# Patient Record
Sex: Female | Born: 1965
Health system: Southern US, Community
[De-identification: ages and names within clinical notes are randomized; demographics above are authoritative.]

## PROBLEM LIST (undated history)

## (undated) DIAGNOSIS — J45909 Unspecified asthma, uncomplicated: Secondary | ICD-10-CM

## (undated) DIAGNOSIS — J309 Allergic rhinitis, unspecified: Secondary | ICD-10-CM

## (undated) HISTORY — PX: NO PAST SURGERIES: SHX2092

## (undated) HISTORY — DX: Allergic rhinitis, unspecified: J30.9

## (undated) HISTORY — DX: Unspecified asthma, uncomplicated: J45.909

---

## 2013-08-18 ENCOUNTER — Other Ambulatory Visit: Payer: BC Managed Care – PPO

## 2013-08-18 ENCOUNTER — Ambulatory Visit (INDEPENDENT_AMBULATORY_CARE_PROVIDER_SITE_OTHER)
Admission: RE | Admit: 2013-08-18 | Discharge: 2013-08-18 | Disposition: A | Discharge status: Home / Self Care | Payer: BC Managed Care – PPO | Source: Ambulatory Visit | Attending: Pulmonary Disease | Admitting: Pulmonary Disease

## 2013-08-18 ENCOUNTER — Ambulatory Visit (INDEPENDENT_AMBULATORY_CARE_PROVIDER_SITE_OTHER): Payer: BC Managed Care – PPO | Admitting: Pulmonary Disease

## 2013-08-18 ENCOUNTER — Encounter (INDEPENDENT_AMBULATORY_CARE_PROVIDER_SITE_OTHER): Payer: Self-pay

## 2013-08-18 ENCOUNTER — Encounter: Payer: Self-pay | Admitting: Pulmonary Disease

## 2013-08-18 VITALS — BP 102/72 | HR 66 | Temp 97.9°F | Ht 63.0 in | Wt 154.0 lb

## 2013-08-18 DIAGNOSIS — J45909 Unspecified asthma, uncomplicated: Secondary | ICD-10-CM

## 2013-08-18 DIAGNOSIS — R0602 Shortness of breath: Secondary | ICD-10-CM

## 2013-08-18 DIAGNOSIS — J309 Allergic rhinitis, unspecified: Secondary | ICD-10-CM

## 2013-08-18 NOTE — Progress Notes (Addendum)
Subjective:    Patient ID: Meagan Herrera, female    DOB: [birth year], 48 y.o.   MRN: [id number]  HPI  Meagan Herrera is here to see me today for asthma.  She states that since January 2014 she developed a hacking cough that lasted for a month.  It was treated with prednisone.  Since then she has had recurrent cough over the course of the year.  She has also noted increasing shortness of breath and has been using her albuterol more often throughout the last year.  She has been feeling a pressure in her chest.  She had allergies as a child and has dealt with them throughout most of her adult life.  She used to experience dyspnea and cough when out in the cold.  She had lung function testing at one point and was started on Advair.  She had PFTs at one point.  She smoked very few cigarettes for about a year only when "at the bar".  Prior to January 2014 she only needed albuterol prn and would go months without using it.  In the last two she has started on Symbicort.  It helped with chest tightness.  Over the last few years she has noted chest tightness and rattling in her chest and dyspnea after taking ketoprofen and ibuprofen.  She also thought that she had an aspirin allergy over the years because her mother told her she had an allergy.  She has noted sinus symptoms prior to starting flonase about 2 years ago.  No symptoms since.  No dietary changes in the last 2-3 years.    She has noted moisture problems in the house and she is having the house being checked for mold.      Past Medical History  Diagnosis Date  . Asthma   . Allergic rhinitis      Family History  Problem Relation Age of Onset  . Emphysema Mother   . Lung cancer Mother   . Allergies Father   . Emphysema Maternal Grandfather      History   Social History  . Marital Status: Married    Spouse Name: N/A    Number of Children: N/A  . Years of Education: N/A   Occupational History  . Not on file.   Social History  Main Topics  . Smoking status: Never Smoker   . Smokeless tobacco: Never Used  . Alcohol Use: Yes  . Drug Use: No  . Sexual Activity: Not on file   Other Topics Concern  . Not on file   Social History Narrative  . No narrative on file     Allergies  Allergen Reactions  . Ibuprofen     Wheezing  . Penicillins Rash  . Sulfa Antibiotics Rash     No outpatient prescriptions prior to visit.   No facility-administered medications prior to visit.      Review of Systems  Constitutional: Negative for fever, chills, diaphoresis, activity change, appetite change, fatigue and unexpected weight change.  HENT: Positive for congestion. Negative for dental problem, ear discharge, ear pain, facial swelling, hearing loss, mouth sores, nosebleeds, postnasal drip, rhinorrhea, sinus pressure, sneezing, sore throat, tinnitus, trouble swallowing and voice change.   Eyes: Negative for photophobia, discharge, itching and visual disturbance.  Respiratory: Positive for cough and shortness of breath. Negative for apnea, choking, chest tightness, wheezing and stridor.   Cardiovascular: Negative for chest pain, palpitations and leg swelling.  Gastrointestinal: Negative for nausea, vomiting, abdominal pain, constipation, blood  in stool and abdominal distention.  Genitourinary: Negative for dysuria, urgency, frequency, hematuria, flank pain, decreased urine volume and difficulty urinating.  Musculoskeletal: Negative for arthralgias, back pain, gait problem, joint swelling, myalgias, neck pain and neck stiffness.  Skin: Negative for color change, pallor and rash.  Neurological: Negative for dizziness, tremors, seizures, syncope, speech difficulty, weakness, light-headedness, numbness and headaches.  Hematological: Negative for adenopathy. Does not bruise/bleed easily.  Psychiatric/Behavioral: Negative for confusion, sleep disturbance and agitation. The patient is not nervous/anxious.        Objective:    Physical Exam Filed Vitals:   08/18/13 1412 08/18/13 1413  BP:  102/72  Pulse:  66  Temp: 97.9 F (36.6 C)   TempSrc: Oral   Height: 5\' 3"  (1.6 m)   Weight: 154 lb (69.854 kg)   SpO2:  96%   RA  Gen: well appearing, no acute distress HEENT: NCAT, PERRL, EOMi, OP clear, neck supple without masses PULM: CTA B CV: RRR, no mgr, no JVD AB: BS+, soft, nontender, no hsm Ext: warm, no edema, no clubbing, no cyanosis Derm: no rash or skin breakdown Neuro: A&Ox4, CN II-XII intact, strength 5/5 in all 4 extremities       Assessment & Plan:   Moderate Persistent Asthma Meagan Herrera has symptoms consistent with moderate persistent asthma.  We need to get lung function testing and a chest x-ray to assess the severity of airflow obstruction and rule out other pulmonary problems.    It is not clear to me why she has "fallen off the cliff" lately in regards to the recent worsening in her symptoms.  For years her symptoms were fairly quiescent, but lately something has changed.  Because of this we need to get a serum IgE and RAST testing.  I question the possibility of salicylate allergy (sampter's triad) given the sinus congestion, history of aspirin allergy since childhood, and asthma.  Plan: -check PFTs, CXR, IgE and RAST testing -if all this comes back OK, will try to decrease symbicort to QVar 53mcg 2 puffs twice per day with a spacer  Allergic rhinitis Her allergic rhinitis could certainly contribute to her asthma severity. -continue singulair, zyrtec, and flonase    Updated Medication List Outpatient Encounter Prescriptions as of 08/18/2013  Medication Sig  . budesonide-formoterol (SYMBICORT) 160-4.5 MCG/ACT inhaler Inhale 2 puffs into the lungs 2 (two) times daily.  . calcium carbonate (OS-CAL) 600 MG TABS tablet Take 600 mg by mouth 2 (two) times daily with a meal.  . cetirizine (ZYRTEC ALLERGY) 10 MG tablet Take 10 mg by mouth daily.  . fluticasone (FLONASE) 50 MCG/ACT nasal spray  Place 2 sprays into both nostrils daily.  . montelukast (SINGULAIR) 10 MG tablet Take 10 mg by mouth at bedtime.   Addendum:  After our visit today the patient found out that her husband has been diagnosed with HIV/AIDS.  Based on this I will order an HIV test.  I have spoken to the patient about this and will help facilitate appropriate counseling based on the results of the test.  Jillyn Hidden PCCM Pager: [phone] Cell: [phone] If no response, call [phone]

## 2013-08-18 NOTE — Patient Instructions (Signed)
We will set up pulmonary function testing and Chest X-rays for you We will call you with the results of those tests and your blood work Keep taking all your medicines for now We will see you back in 4-6 weeks or sooner if needed

## 2013-08-18 NOTE — Assessment & Plan Note (Signed)
Her allergic rhinitis could certainly contribute to her asthma severity. -continue singulair, zyrtec, and flonase

## 2013-08-18 NOTE — Assessment & Plan Note (Signed)
Meagan Herrera has symptoms consistent with moderate persistent asthma.  We need to get lung function testing and a chest x-ray to assess the severity of airflow obstruction and rule out other pulmonary problems.    It is not clear to me why she has "fallen off the cliff" lately in regards to the recent worsening in her symptoms.  For years her symptoms were fairly quiescent, but lately something has changed.  Because of this we need to get a serum IgE and RAST testing.  I question the possibility of salicylate allergy (sampter's triad) given the sinus congestion, history of aspirin allergy since childhood, and asthma.  Plan: -check PFTs, CXR, IgE and RAST testing -if all this comes back OK, will try to decrease symbicort to QVar 36mcg 2 puffs twice per day with a spacer

## 2013-08-19 ENCOUNTER — Ambulatory Visit (HOSPITAL_COMMUNITY)
Admission: RE | Admit: 2013-08-19 | Discharge: 2013-08-19 | Disposition: A | Discharge status: Home / Self Care | Payer: BC Managed Care – PPO | Source: Ambulatory Visit | Attending: Pulmonary Disease | Admitting: Pulmonary Disease

## 2013-08-19 ENCOUNTER — Ambulatory Visit: Payer: BC Managed Care – PPO

## 2013-08-19 ENCOUNTER — Telehealth: Payer: Self-pay | Admitting: Pulmonary Disease

## 2013-08-19 DIAGNOSIS — R0609 Other forms of dyspnea: Secondary | ICD-10-CM | POA: Insufficient documentation

## 2013-08-19 DIAGNOSIS — Z79899 Other long term (current) drug therapy: Secondary | ICD-10-CM | POA: Insufficient documentation

## 2013-08-19 DIAGNOSIS — J45909 Unspecified asthma, uncomplicated: Secondary | ICD-10-CM | POA: Insufficient documentation

## 2013-08-19 DIAGNOSIS — R0602 Shortness of breath: Secondary | ICD-10-CM

## 2013-08-19 DIAGNOSIS — R0989 Other specified symptoms and signs involving the circulatory and respiratory systems: Secondary | ICD-10-CM | POA: Insufficient documentation

## 2013-08-19 LAB — PULMONARY FUNCTION TEST
DL/VA % pred: 107 %
DL/VA: 4.87 ml/min/mmHg/L
DLCO UNC % PRED: 127 %
DLCO unc: 27.41 ml/min/mmHg
FEF 25-75 PRE: 2.39 L/s
FEF 25-75 Post: 2.08 L/sec
FEF2575-%Change-Post: -13 %
FEF2575-%PRED-POST: 78 %
FEF2575-%Pred-Pre: 90 %
FEV1-%CHANGE-POST: -2 %
FEV1-%PRED-POST: 106 %
FEV1-%Pred-Pre: 109 %
FEV1-Post: 2.79 L
FEV1-Pre: 2.87 L
FEV1FVC-%Change-Post: -3 %
FEV1FVC-%Pred-Pre: 95 %
FEV6-%Change-Post: 0 %
FEV6-%PRED-POST: 117 %
FEV6-%PRED-PRE: 116 %
FEV6-POST: 3.77 L
FEV6-PRE: 3.75 L
FEV6FVC-%CHANGE-POST: 0 %
FEV6FVC-%PRED-PRE: 102 %
FEV6FVC-%Pred-Post: 102 %
FVC-%Change-Post: 0 %
FVC-%PRED-PRE: 113 %
FVC-%Pred-Post: 114 %
FVC-POST: 3.78 L
FVC-PRE: 3.75 L
POST FEV6/FVC RATIO: 100 %
Post FEV1/FVC ratio: 74 %
Pre FEV1/FVC ratio: 76 %
Pre FEV6/FVC Ratio: 100 %
RV % PRED: 148 %
RV: 2.49 L
TLC % PRED: 131 %
TLC: 6.23 L

## 2013-08-19 LAB — IGE: IGE (IMMUNOGLOBULIN E), SERUM: 42.5 [IU]/mL (ref 0.0–180.0)

## 2013-08-19 MED ORDER — ALBUTEROL SULFATE (2.5 MG/3ML) 0.083% IN NEBU
2.5000 mg | INHALATION_SOLUTION | Freq: Once | RESPIRATORY_TRACT | Status: AC
  Administered 2013-08-19: 2.5 mg via RESPIRATORY_TRACT

## 2013-08-19 NOTE — Telephone Encounter (Signed)
Pt is calling for lab results from yesterday and this morning.

## 2013-08-19 NOTE — Telephone Encounter (Signed)
Notes Recorded by Juanito Doom, MD on 08/18/2013 at 9:44 PM A, Please let her know that this was normal Thanks B --  I spoke with patient about results and she verbalized understanding and had no questions. She is also requesting her lab results as well. Please advise Dr. Lake Bells thanks

## 2013-08-19 NOTE — Telephone Encounter (Signed)
I received a call from the lab and orders needed to be made future. This has been done. Finlayson Bing, CMA

## 2013-08-19 NOTE — Telephone Encounter (Signed)
I called and spoke w/ Meagan Herrera. Advised her we have the message over to Dr. Lake Bells. Please advise thanks

## 2013-08-19 NOTE — Addendum Note (Signed)
Addended by: Simonne Maffucci B on: 08/19/2013 09:43 AM   Modules accepted: Orders

## 2013-08-20 LAB — ALLERGY FULL PROFILE
Allergen, D pternoyssinus,d7: 3.22 kU/L — ABNORMAL HIGH
Allergen,Goose feathers, e70: <0.1 kU/L
Aspergillus fumigatus, m3: <0.1 kU/L
BAHIA GRASS: 1.79 kU/L — AB
Bermuda Grass: 0.24 kU/L — ABNORMAL HIGH
CAT DANDER: 5.19 kU/L — AB
Common Ragweed: <0.1 kU/L
D. farinae: 5.1 kU/L — ABNORMAL HIGH
DOG DANDER: 0.25 kU/L — AB
Elm IgE: <0.1 kU/L
FESCUE: 5.48 kU/L — AB
G005 Rye, Perennial: 5.13 kU/L — ABNORMAL HIGH
G009 Red Top: 5.65 kU/L — ABNORMAL HIGH
House Dust Hollister: 1.41 kU/L — ABNORMAL HIGH
IGE (IMMUNOGLOBULIN E), SERUM: 45.7 [IU]/mL (ref 0.0–180.0)
LAMB'S QUARTERS CLASS: 0.11 kU/L — AB
Oak: 0.42 kU/L — ABNORMAL HIGH
Plantain: <0.1 kU/L
Stemphylium Botryosum: <0.1 kU/L
Sycamore Tree: <0.1 kU/L
Timothy Grass: 4.13 kU/L — ABNORMAL HIGH

## 2013-08-20 LAB — HIV ANTIBODY (ROUTINE TESTING W REFLEX): HIV: NONREACTIVE

## 2013-08-20 NOTE — Telephone Encounter (Signed)
Called spoke with patient.  She stated that she is currently at the hospital with her spouse and saw BQ there, who reported that he will return to speak with her regarding her results and any additional questions she has.  Will sign off.

## 2013-08-20 NOTE — Telephone Encounter (Signed)
Pt states she spoke to Dr Lake Bells this morning regarding her tests from yesterday but she forgot to ask him about the results for tests from 3/17.

## 2013-08-21 ENCOUNTER — Telehealth: Payer: Self-pay

## 2013-08-21 ENCOUNTER — Encounter: Payer: Self-pay | Admitting: Pulmonary Disease

## 2013-08-21 NOTE — Telephone Encounter (Signed)
Message copied by Len Blalock on Fri Aug 21, 2013  3:37 PM ------      Message from: Simonne Maffucci B      Created: Fri Aug 21, 2013  9:02 AM       A,            Please let her know that the PFT showed mild asthma and there is no need to change our recommendations from our office visit.            Thanks      B ------

## 2013-08-21 NOTE — Telephone Encounter (Signed)
Pt aware of results and recs.  Nothing further needed. 

## 2013-09-29 ENCOUNTER — Encounter: Payer: Self-pay | Admitting: Pulmonary Disease

## 2013-09-29 ENCOUNTER — Ambulatory Visit (INDEPENDENT_AMBULATORY_CARE_PROVIDER_SITE_OTHER): Payer: BC Managed Care – PPO | Admitting: Pulmonary Disease

## 2013-09-29 VITALS — BP 114/76 | HR 71 | Ht 63.0 in | Wt 147.0 lb

## 2013-09-29 DIAGNOSIS — J45909 Unspecified asthma, uncomplicated: Secondary | ICD-10-CM

## 2013-09-29 MED ORDER — BECLOMETHASONE DIPROPIONATE 80 MCG/ACT IN AERS: 2.0000 | INHALATION_SPRAY | Freq: Two times a day (BID) | RESPIRATORY_TRACT | Status: DC

## 2013-09-29 MED ORDER — AEROCHAMBER MV MISC: Status: DC

## 2013-09-29 NOTE — Patient Instructions (Signed)
Stop Symbicort Use QVar 2 puffs twice a day with a spacer If you are doing fine in 6 weeks, decrease the dose to 1 puff twice a day We will see you back in 3 months or sooner if needed

## 2013-09-29 NOTE — Progress Notes (Signed)
   Subjective:    Patient ID: Meagan Herrera, female    DOB: 07-07-1965, 48 y.o.   MRN: [id number]   Synopsis: Mild intermittent asthma referred initially in early 2015 for recurrent exacerbations. IgE was found to be normal. 08/18/2013 PFT> ratio 74%, FEV1 2.79L (106% pred, -2% change), TLC 6.23L (131% pred), DLCO 21.56 (127% pred)  HPI  09/29/2013 ROV> Since her last visit [name] has been doing well. Her breathing has improved and she has not had to use her albuterol inhaler very often. She and her family have been under a very high amount of stress because her husband has been critically ill. So she states that she intermittently experiences chest tightness but she feels that this is mostly anxiety and not related to her asthma. She has not noticed exertional dyspnea. She has no cough.  Past Medical History  Diagnosis Date  . Asthma   . Allergic rhinitis      Review of Systems     Objective:   Physical Exam  Filed Vitals:   09/29/13 1422  BP: 114/76  Pulse: 71  Height: 5\' 3"  (1.6 m)  Weight: 147 lb (66.679 kg)  SpO2: 99%  RA  Gen: well appearing, no acute distress HEENT: NCAT, EOMi, OP clear,  PULM: CTA B CV: RRR, no mgr, no JVD AB: BS+, soft, nontender, no hsm Ext: warm, no edema, no clubbing, no cyanosis Derm: no rash or skin breakdown        Assessment & Plan:   Moderate Persistent Asthma This has been a stable interval for [name]. There is no clear underlying cause for her exacerbations back in the wintertime other than recurrent infections.  Plan: -Stop Symbicort -Start Qvar for 3 months -Hopefully we can back off on the steroid inhaler by the summertime if she remains stable    Updated Medication List Outpatient Encounter Prescriptions as of 09/29/2013  Medication Sig  . ALPRAZolam (XANAX) 0.25 MG tablet Take 0.25 mg by mouth at bedtime as needed for anxiety.  . budesonide-formoterol (SYMBICORT) 160-4.5 MCG/ACT inhaler Inhale 2 puffs into the lungs 2 (two)  times daily.  . cetirizine (ZYRTEC ALLERGY) 10 MG tablet Take 10 mg by mouth daily.  . fluticasone (FLONASE) 50 MCG/ACT nasal spray Place 2 sprays into both nostrils daily.  . montelukast (SINGULAIR) 10 MG tablet Take 10 mg by mouth at bedtime.  . Multiple Vitamin (MULTIVITAMIN WITH MINERALS) TABS tablet Take 1 tablet by mouth daily.  . [DISCONTINUED] calcium carbonate (OS-CAL) 600 MG TABS tablet Take 600 mg by mouth 2 (two) times daily with a meal.

## 2013-09-30 NOTE — Assessment & Plan Note (Signed)
This has been a stable interval for Meagan Herrera. There is no clear underlying cause for her exacerbations back in the wintertime other than recurrent infections.  Plan: -Stop Symbicort -Start Qvar for 3 months -Hopefully we can back off on the steroid inhaler by the summertime if she remains stable

## 2013-10-29 ENCOUNTER — Telehealth: Payer: Self-pay | Admitting: Pulmonary Disease

## 2013-10-29 NOTE — Telephone Encounter (Signed)
Spoke with pt.  Pt needs form filled out for out of work time due to husband's death.  Her out of work dates were 08/18/13 through 10/11/13.  Form placed in Dr Anastasia Pall look at.

## 2013-10-29 NOTE — Telephone Encounter (Signed)
Spoke with Dr Lake Bells.  Form completed.  Copy placed in scan folder.

## 2015-01-07 ENCOUNTER — Ambulatory Visit (INDEPENDENT_AMBULATORY_CARE_PROVIDER_SITE_OTHER): Payer: BLUE CROSS/BLUE SHIELD | Admitting: Podiatry

## 2015-01-07 ENCOUNTER — Ambulatory Visit (INDEPENDENT_AMBULATORY_CARE_PROVIDER_SITE_OTHER): Payer: BLUE CROSS/BLUE SHIELD

## 2015-01-07 VITALS — BP 105/63 | HR 47 | Resp 15 | Ht 63.0 in | Wt 138.0 lb

## 2015-01-07 DIAGNOSIS — M79672 Pain in left foot: Secondary | ICD-10-CM

## 2015-01-07 DIAGNOSIS — D3610 Benign neoplasm of peripheral nerves and autonomic nervous system, unspecified: Secondary | ICD-10-CM | POA: Diagnosis not present

## 2015-01-07 DIAGNOSIS — M2042 Other hammer toe(s) (acquired), left foot: Secondary | ICD-10-CM

## 2015-01-07 NOTE — Progress Notes (Signed)
   Subjective:    Patient ID: Meagan Herrera, female    DOB: [birth year], 49 y.o.   MRN: [id number]  HPI Patient presents with foot pain in their left foot, ball of foot. Pt stated, "has discomfort in foot, feels like a marble in foot"; Going on for 3 months.  Patient also presents with possible hammertoe, left foot, great toe is going over 2nd toe. Pt stated, "just noticed in the past month".    Review of Systems  All other systems reviewed and are negative.      Objective:   Physical Exam        Assessment & Plan:

## 2015-01-10 NOTE — Progress Notes (Signed)
Subjective:     Patient ID: Meagan Herrera, female   DOB: 1965-12-28, 49 y.o.   MRN: [id number]  HPI patient presents stating she was concerned because it feels like a marble and her left foot even though it's not hurting at this time and also that there is been some movement of her second toe across her big toe. Does not give history of injury   Review of Systems  All other systems reviewed and are negative.      Objective:   Physical Exam  Constitutional: She is oriented to person, place, and time.  Cardiovascular: Intact distal pulses.   Musculoskeletal: Normal range of motion.  Neurological: She is oriented to person, place, and time.  Skin: Skin is warm.  Nursing note and vitals reviewed.  neurovascular status intact muscle strength adequate range of motion within normal limits. Patient's noted to have good digital perfusion is well oriented 3 with no equinus condition and is noted to have medial and dorsal dislocation of the second toe left with mild inflammation of the joint and is found to have a palpable mass third interspace left that is nontender when pressed on     Assessment:     Probable flexor plate stretch of the second MPJ left with possibility for neuroma symptomatology left currently nonsymptomatic    Plan:     H&P and x-rays reviewed. Advised her on the causes of condition and flexor plate dysfunction along with possibility for neuroma and what we will do if it becomes symptomatic. Wear wider-type shoes and soaks at the current time

## 2015-03-23 DIAGNOSIS — M2042 Other hammer toe(s) (acquired), left foot: Secondary | ICD-10-CM

## 2015-04-01 ENCOUNTER — Telehealth: Payer: Self-pay | Admitting: Podiatry

## 2015-04-01 NOTE — Telephone Encounter (Signed)
Called pt to let her know I had a copy of her x-rays on a CD ready for her. I offered her to come to our [place] office to pick them up, take them to our main office for her to pick up, or that I could put them in the mail to her. Pt requested to come to the [place] office this afternoon to pick them up.

## 2015-10-13 ENCOUNTER — Ambulatory Visit (INDEPENDENT_AMBULATORY_CARE_PROVIDER_SITE_OTHER): Payer: BLUE CROSS/BLUE SHIELD | Admitting: Neurology

## 2015-10-13 ENCOUNTER — Encounter: Payer: Self-pay | Admitting: Neurology

## 2015-10-13 VITALS — BP 90/53 | HR 52 | Ht 63.0 in | Wt 126.5 lb

## 2015-10-13 DIAGNOSIS — R253 Fasciculation: Secondary | ICD-10-CM | POA: Diagnosis not present

## 2015-10-13 DIAGNOSIS — L299 Pruritus, unspecified: Secondary | ICD-10-CM | POA: Diagnosis not present

## 2015-10-13 MED ORDER — LIDOCAINE 5 % EX OINT: 1.0000 "application " | TOPICAL_OINTMENT | CUTANEOUS | Status: DC | PRN

## 2015-10-13 NOTE — Progress Notes (Signed)
[id number]    Provider:  Dr Jaynee Eagles Referring Provider: Bartholome Bill, MD Primary Care Physician:  Bartholome Bill, MD  CC:  Eyelid twitching  HPI:  Meagan Herrera is a 50 y.o. female here as a referral from Dr. Luciana Axe for eye lid twitching that started at the beginning of this year. Past medical history of osteopenia, hip and foot pain. It doesn't cause discomfort, she ca';t feel it. It is happening in the left eye and she can feel it. No muscle twitching anywhere else. She also has an itching, tingling small spot in on the inside of the scapular, itching daily for years. No trauma. No pain or weakness. Some days is worse than others. She takes daily antihistamine. She drinks caffeine daily. She recently decreased her caffeine intake and the twitching in her right eyelid occurred. Doesn't happen all the time she does not think. If she stretches her eye out she can make a twitch more. No vision changes. No headaches. No other focal neurologic deficits or sensory changes. No history of neuromuscular disorders in her family. She was sent for evaluation for Botox of the right eye. However she doesn't notice the symptoms only when she looks in the [place] and they're not causing any deficits whatsoever. No weakness. No family history of neuromuscular disorder.  Reviewed notes, labs and imaging from outside physicians, which showed: Reviewed notes from outside physician. Diagnosed her with spasm of the eyelid. CMP, CBC, TSH, rheumatoid factor, ANA with reflex, sedimentation rate and magnesium level ordered. She has complained of stiffness and pain in the right hip and feet started a year ago had more recent. She started dancing since this is contributing to the pain. Her brother had parathyroid disease and she has osteopenia. She also has a constant right twitch on the right eye for several months she denies headaches or changes in her vision. Started in January. Exam was normal at  primary care physician.  HIV 08/19/2013 negative.  Review of Systems: Patient complains of symptoms per HPI as well as the following symptoms: . Pertinent negatives per HPI. All others negative.   Social History   Social History  . Marital Status: Widowed    Spouse Name: N/A  . Number of Children: 2  . Years of Education: Masters   Occupational History  . [employer]    Social History Main Topics  . Smoking status: Never Smoker   . Smokeless tobacco: Never Used  . Alcohol Use: 0.0 oz/week    0 Standard drinks or equivalent per week     Comment: 0-1 drink per week  . Drug Use: No  . Sexual Activity: Not on file   Other Topics Concern  . Not on file   Social History Narrative   Lives with 2 children   Caffeine use: 1/2 decaf and 1/2 regular coffee per day    Family History  Problem Relation Age of Onset  . Emphysema Mother   . Lung cancer Mother   . Allergies Father   . Emphysema Maternal Grandfather     Past Medical History  Diagnosis Date  . Asthma   . Allergic rhinitis     Past Surgical History  Procedure Laterality Date  . No past surgeries      Current Outpatient Prescriptions  Medication Sig Dispense Refill  . Calcium Carbonate-Vit D-Min (CALTRATE 600+D PLUS PO) Take 1 Dose by mouth daily. 2000 Vit D    . cetirizine (ZYRTEC ALLERGY) 10 MG tablet Take  10 mg by mouth daily.    . fluticasone (FLONASE) 50 MCG/ACT nasal spray Place 2 sprays into both nostrils daily.    . montelukast (SINGULAIR) 10 MG tablet Take 10 mg by mouth at bedtime.    Marland Kitchen Spacer/Aero-Holding Chambers (AEROCHAMBER MV) inhaler Use as instructed 1 each 0   No current facility-administered medications for this visit.    Allergies as of 10/13/2015 - Review Complete 10/13/2015  Allergen Reaction Noted  . Ibuprofen  08/18/2013  . Penicillins Rash 08/18/2013  . Sulfa antibiotics Rash 08/18/2013    Vitals: BP 90/53 mmHg  Pulse 52  Ht 5\' 3"  (1.6 m)  Wt 126 lb 8 oz (57.38  kg)  BMI 22.41 kg/m2 Last Weight:  Wt Readings from Last 1 Encounters:  10/13/15 126 lb 8 oz (57.38 kg)   Last Height:   Ht Readings from Last 1 Encounters:  10/13/15 5\' 3"  (1.6 m)    Physical exam: Exam: Gen: NAD, conversant, well nourised, obese, well groomed                     CV: RRR, no MRG. No Carotid Bruits. No peripheral edema, warm, nontender Eyes: Conjunctivae clear without exudates or hemorrhage  Neuro: Detailed Neurologic Exam  Speech:    Speech is normal; fluent and spontaneous with normal comprehension.  Cognition:    The patient is oriented to person, place, and time;     recent and remote memory intact;     language fluent;     normal attention, concentration,     fund of knowledge Cranial Nerves:    The pupils are equal, round, and reactive to light. The fundi are normal and spontaneous venous pulsations are present. Visual fields are full to finger confrontation. Extraocular movements are intact. Trigeminal sensation is intact and the muscles of mastication are normal. The face is symmetric. The palate elevates in the midline. Hearing intact. Voice is normal. Shoulder shrug is normal. The tongue has normal motion without fasciculations.   Coordination:    Normal finger to nose and heel to shin. Normal rapid alternating movements.   Gait:    Heel-toe and tandem gait are normal.   Motor Observation:    No asymmetry, no atrophy. Brief episode of right eyelid twitching noted.  Tone:    Normal muscle tone.    Posture:    Posture is normal. normal erect    Strength:    Strength is V/V in the upper and lower limbs.      Sensation: intact to LT     Reflex Exam:  DTR's:    Deep tendon reflexes in the upper and lower extremities are normal bilaterally.   Toes:    The toes are downgoing bilaterally.   Clonus:    Clonus is absent.      Assessment/Plan:  50 year old female with right eyelid twitching and a spot on the skin of the right scapula that  itches. Do not recommend Botox in the right eye at this time, she doesn't feel the symptoms, they don't bother her, they don't cause any side effects or any vision changes. Advise her to stop caffeine and daily antihistamine use as these can cause benign fasciculations. For the spot in her right shoulder, can try topical lidocaine several times a day otherwise she should possibly follow-up with primary care or a dermatologist.  Cc: Precious Haws M.D.  Sarina Ill, MD  [facility] [address] [place], [place] [address]  Phone  [phone] Fax [phone]

## 2015-10-13 NOTE — Patient Instructions (Signed)
Remember to drink plenty of fluid, eat healthy meals and do not skip any meals. Try to eat protein with a every meal and eat a healthy snack such as fruit or nuts in between meals. Try to keep a regular sleep-wake schedule and try to exercise daily, particularly in the form of walking, 20-30 minutes a day, if you can.   As far as your medications are concerned, I would like to suggest: Topical Lidocaine  As far as diagnostic testing: none  I would like to see you back as needed, sooner if we need to. Please call us with any interim questions, concerns, problems, updates or refill requests.   Our phone number is [phone]. We also have an after hours call service for urgent matters and there is a physician on-call for urgent questions. For any emergencies you know to call 911 or go to the nearest emergency room

## 2015-10-14 ENCOUNTER — Encounter: Payer: Self-pay | Admitting: Neurology

## 2015-10-28 ENCOUNTER — Telehealth: Payer: Self-pay | Admitting: *Deleted

## 2015-10-28 NOTE — Telephone Encounter (Signed)
Message For: [facility]                  Taken 26-MAY-17 at 10:09AM by TMW ------------------------------------------------------------ Susy Frizzle [facility]     CID [id number]  Patient Meagan Herrera        Pt's [facility]        Area Code 336 Phone# [phone]  * DOB [phone]      RE DID PATIENT COME TO APPT ON MAY 11.PCB PM [place]                                        Disp:Y/N N If Y = C/B If No Response In 63minutes ============================================================

## 2017-01-08 ENCOUNTER — Other Ambulatory Visit: Payer: Self-pay | Admitting: Family Medicine

## 2017-01-08 ENCOUNTER — Ambulatory Visit
Admission: RE | Admit: 2017-01-08 | Discharge: 2017-01-08 | Disposition: A | Discharge status: Home / Self Care | Payer: BC Managed Care – PPO | Source: Ambulatory Visit | Attending: Family Medicine | Admitting: Family Medicine

## 2017-01-08 DIAGNOSIS — M533 Sacrococcygeal disorders, not elsewhere classified: Secondary | ICD-10-CM

## 2017-02-11 LAB — HM PAP SMEAR

## 2018-05-26 IMAGING — CR DG SACRUM/COCCYX 2+V
3 series · 3 of 3 positions shown · non-contrast
Comparison: None.

CLINICAL DATA: 51-year-old female with sacrococcygeal pain when
sitting or lying down for 3 months with no known injury.

EXAM:
SACRUM AND COCCYX - 2+ VIEW

[w sacrum ap]
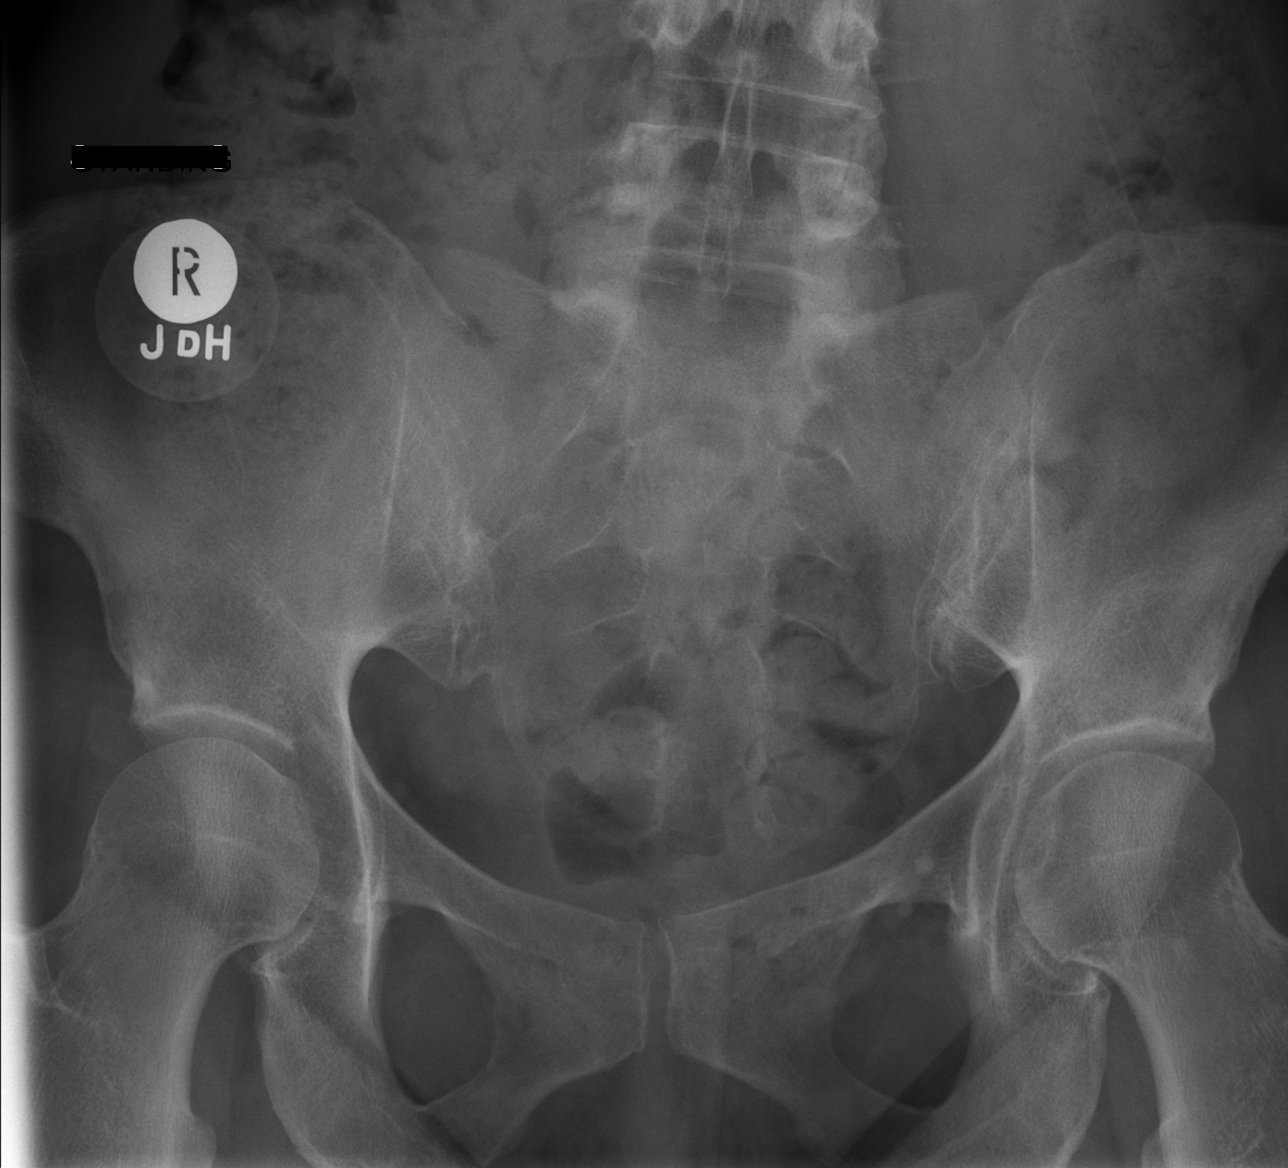

[w coccyx ap]
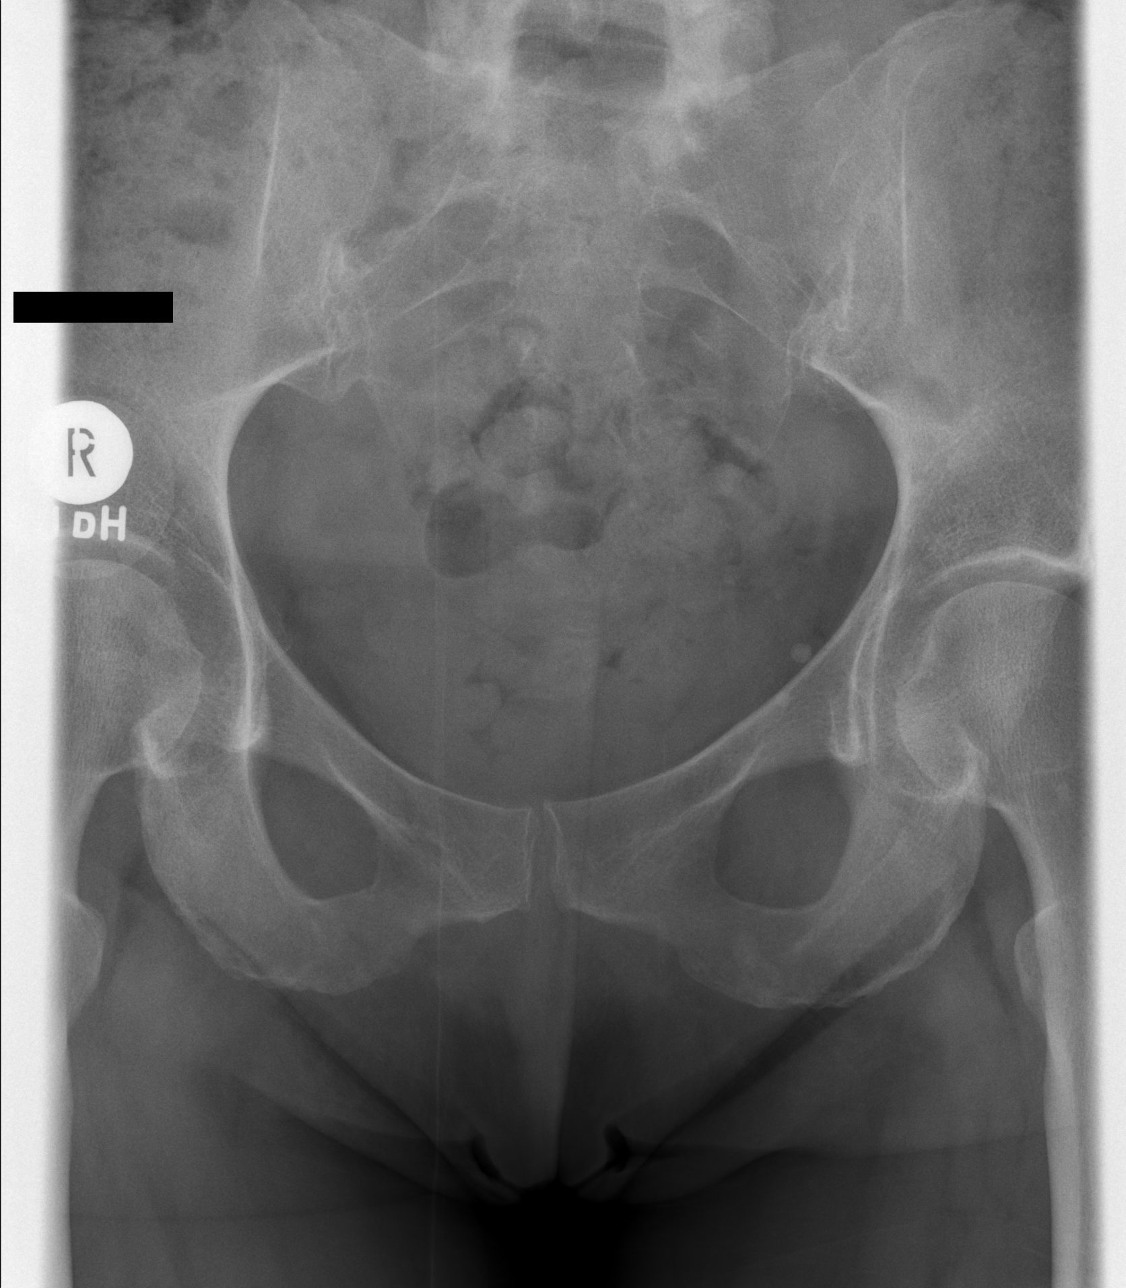

[w sacrum coccyx lat]
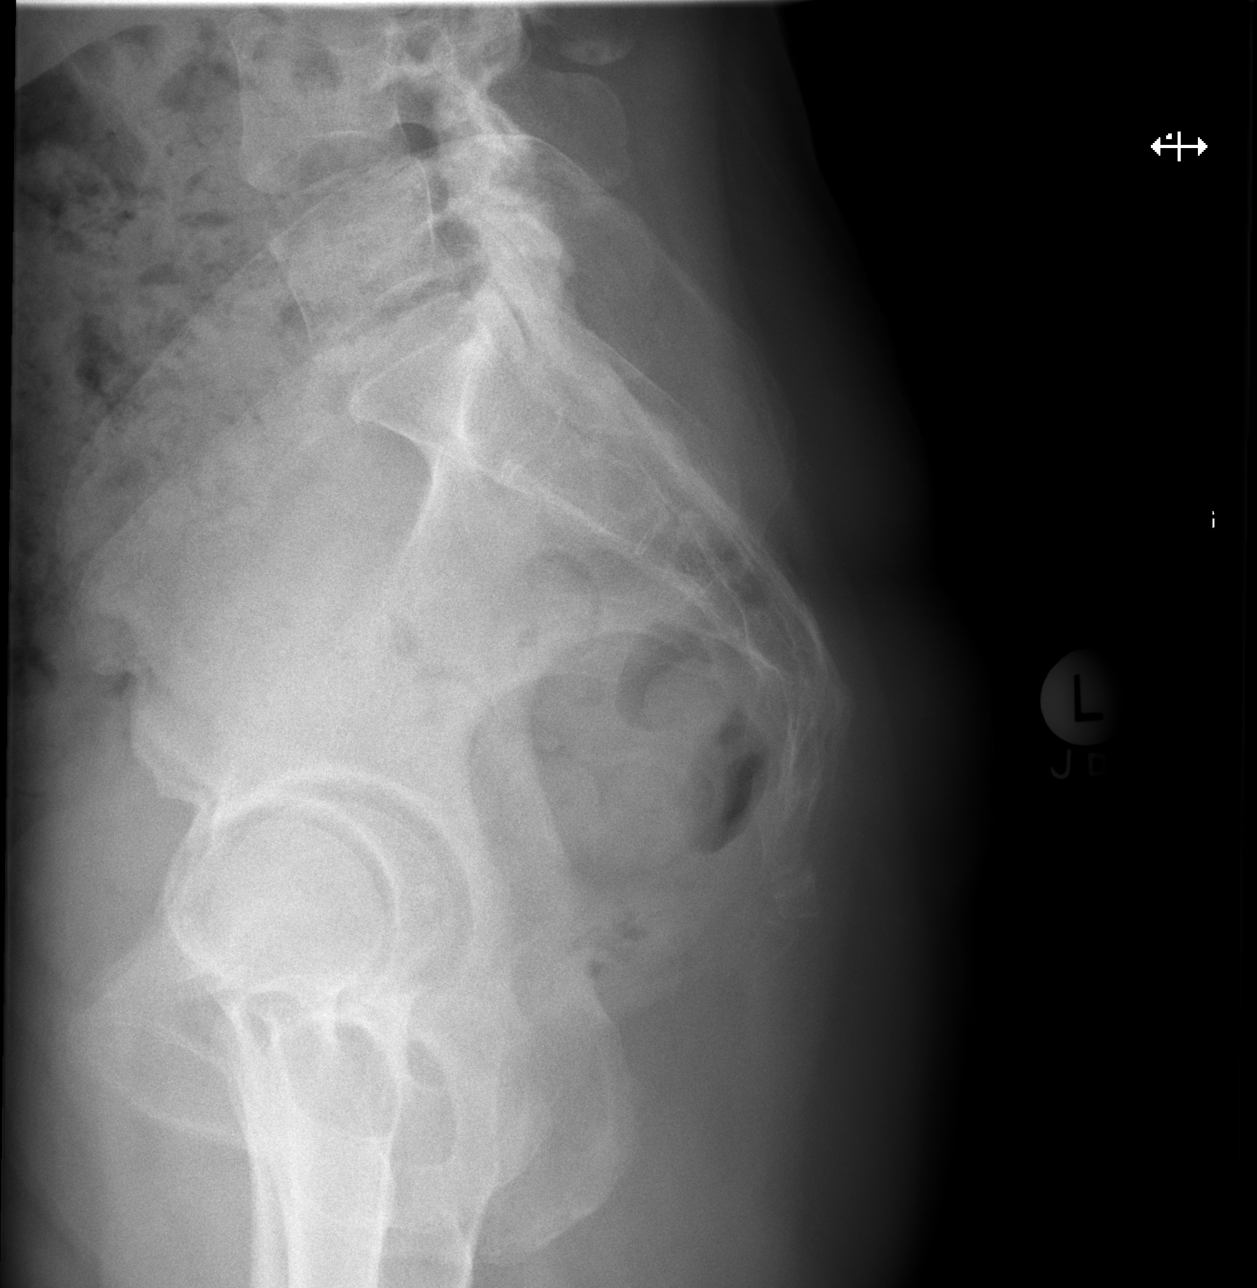

[3 of 3 positions shown; findings below may reference images not displayed]

FINDINGS: Standing views. The sacral ala appear intact. The SI joints appear
normal. On the lateral view there is irregular S5 sacral segment and
coccygeal alignment, which appears chronic. Underlying Bone
mineralization is within normal limits. Negative visible lumbar
spine. The visible pelvis elsewhere appears intact. Negative visible
bowel gas pattern with retained stool in the colon.
IMPRESSION: Possible prior coccygeal fracture, but no acute osseous abnormality
identified.

## 2018-10-06 DIAGNOSIS — M7541 Impingement syndrome of right shoulder: Secondary | ICD-10-CM | POA: Insufficient documentation

## 2019-01-13 ENCOUNTER — Ambulatory Visit (INDEPENDENT_AMBULATORY_CARE_PROVIDER_SITE_OTHER): Payer: BC Managed Care – PPO | Admitting: Family Medicine

## 2019-01-13 ENCOUNTER — Encounter: Payer: Self-pay | Admitting: Family Medicine

## 2019-01-13 VITALS — BP 98/60 | HR 49 | Temp 98.1°F | Ht 63.0 in | Wt 120.0 lb

## 2019-01-13 DIAGNOSIS — M25562 Pain in left knee: Secondary | ICD-10-CM

## 2019-01-13 DIAGNOSIS — Z13220 Encounter for screening for lipoid disorders: Secondary | ICD-10-CM

## 2019-01-13 DIAGNOSIS — R7989 Other specified abnormal findings of blood chemistry: Secondary | ICD-10-CM

## 2019-01-13 DIAGNOSIS — Z0000 Encounter for general adult medical examination without abnormal findings: Secondary | ICD-10-CM

## 2019-01-13 LAB — CBC WITH DIFFERENTIAL/PLATELET
Basophils Absolute: 0 10*3/uL (ref 0.0–0.1)
Basophils Relative: 0.6 % (ref 0.0–3.0)
Eosinophils Absolute: 0.2 10*3/uL (ref 0.0–0.7)
Eosinophils Relative: 4.2 % (ref 0.0–5.0)
HCT: 43 % (ref 36.0–46.0)
Hemoglobin: 14 g/dL (ref 12.0–15.0)
Lymphocytes Relative: 49.8 % — ABNORMAL HIGH (ref 12.0–46.0)
Lymphs Abs: 2.1 10*3/uL (ref 0.7–4.0)
MCHC: 32.6 g/dL (ref 30.0–36.0)
MCV: 92.6 fl (ref 78.0–100.0)
Monocytes Absolute: 0.3 10*3/uL (ref 0.1–1.0)
Monocytes Relative: 7 % (ref 3.0–12.0)
Neutro Abs: 1.6 10*3/uL (ref 1.4–7.7)
Neutrophils Relative %: 38.4 % — ABNORMAL LOW (ref 43.0–77.0)
Platelets: 201 10*3/uL (ref 150.0–400.0)
RBC: 4.64 Mil/uL (ref 3.87–5.11)
RDW: 13.4 % (ref 11.5–15.5)
WBC: 4.2 10*3/uL (ref 4.0–10.5)

## 2019-01-13 LAB — LIPID PANEL
Cholesterol: 193 mg/dL (ref 0–200)
HDL: 74.8 mg/dL (ref >39.00)
LDL Cholesterol: 102 mg/dL — ABNORMAL HIGH (ref 0–99)
NonHDL: 118.64
Total CHOL/HDL Ratio: 3
Triglycerides: 81 mg/dL (ref 0.0–149.0)
VLDL: 16.2 mg/dL (ref 0.0–40.0)

## 2019-01-13 LAB — COMPREHENSIVE METABOLIC PANEL
ALT: 14 U/L (ref 0–35)
AST: 22 U/L (ref 0–37)
Albumin: 4.4 g/dL (ref 3.5–5.2)
Alkaline Phosphatase: 70 U/L (ref 39–117)
BUN: 12 mg/dL (ref 6–23)
CO2: 28 mEq/L (ref 19–32)
Calcium: 10 mg/dL (ref 8.4–10.5)
Chloride: 106 mEq/L (ref 96–112)
Creatinine, Ser: 0.83 mg/dL (ref 0.40–1.20)
GFR: 71.3 mL/min (ref >60.00)
Glucose, Bld: 87 mg/dL (ref 70–99)
Potassium: 4.5 mEq/L (ref 3.5–5.1)
Sodium: 141 mEq/L (ref 135–145)
Total Bilirubin: 0.6 mg/dL (ref 0.2–1.2)
Total Protein: 6.9 g/dL (ref 6.0–8.3)

## 2019-01-13 LAB — TSH: TSH: 4.07 u[IU]/mL (ref 0.35–4.50)

## 2019-01-13 LAB — VITAMIN D 25 HYDROXY (VIT D DEFICIENCY, FRACTURES): VITD: 47.85 ng/mL (ref 30.00–100.00)

## 2019-01-13 NOTE — Assessment & Plan Note (Signed)
Well adult Orders Placed This Encounter  Procedures  . Comp Met (CMET)  . CBC w/Diff  . Lipid panel  . TSH  . Vitamin D (25 hydroxy)  . Ambulatory referral to Orthopedic Surgery    Referral Priority:   Routine    Referral Type:   Surgical    Referral Reason:   Specialty Services Required    Requested Specialty:   Orthopedic Surgery    Number of Visits Requested:   1  Screening: Lipid Immunizations: UTD Anticipatory guidance/Risk Factor Reduction: Recommendations per AVS.

## 2019-01-13 NOTE — Assessment & Plan Note (Signed)
Referral placed to ortho

## 2019-01-13 NOTE — Patient Instructions (Signed)

## 2019-01-13 NOTE — Progress Notes (Signed)
Meagan Herrera - 53 y.o. female MRN [id number]  Date of birth: [birth year]  Subjective Chief Complaint  Patient presents with  . Establish Care    CPE- fasting/ referral to ortho?/ denies tdap/ left big toenails split    HPI Meagan Herrera is a 53 y.o. female here today for initial visit and annual exam.  She has a history of asthma but has otherwise been fairly healthy.  She was recently seen by her previous PCP for L knee pain and prescribed prednisone.  She is hesitant to take this and would like to see orthopedics.  She reports that her asthma is fairly well controlled.  There is a family history of lung cancer in both of her parents who were smokers.  She is a lifelong nonsmoker.  Paps and mammogram are up to date and she has these completed with her Gyn.  She had cologuard last May.  She stays active and exercises a few times per week.  She follows a balanced diet.     Review of Systems  Constitutional: Negative for chills, fever, malaise/fatigue and weight loss.  HENT: Negative for congestion, ear pain and sore throat.   Eyes: Negative for blurred vision, double vision and pain.  Respiratory: Negative for cough and shortness of breath.   Cardiovascular: Negative for chest pain and palpitations.  Gastrointestinal: Negative for abdominal pain, blood in stool, constipation, heartburn and nausea.  Genitourinary: Negative for dysuria and urgency.  Musculoskeletal: Positive for joint pain (L knee). Negative for myalgias.  Neurological: Negative for dizziness and headaches.  Endo/Heme/Allergies: Does not bruise/bleed easily.  Psychiatric/Behavioral: Negative for depression. The patient is not nervous/anxious and does not have insomnia.      Allergies  Allergen Reactions  . Ibuprofen     Wheezing  . Penicillins Rash  . Sulfa Antibiotics Rash    Past Medical History:  Diagnosis Date  . Allergic rhinitis   . Asthma     Past Surgical History:  Procedure Laterality Date  . NO PAST  SURGERIES      Social History   Socioeconomic History  . Marital status: Widowed    Spouse name: Not on file  . Number of children: 2  . Years of education: Masters  . Highest education level: Not on file  Occupational History  . Occupation: [employer]  Social Needs  . Financial resource strain: Not on file  . Food insecurity    Worry: Not on file    Inability: Not on file  . Transportation needs    Medical: Not on file    Non-medical: Not on file  Tobacco Use  . Smoking status: Never Smoker  . Smokeless tobacco: Never Used  Substance and Sexual Activity  . Alcohol use: Yes    Alcohol/week: 0.0 standard drinks    Comment: 0-1 drink per week  . Drug use: No  . Sexual activity: Not on file  Lifestyle  . Physical activity    Days per week: Not on file    Minutes per session: Not on file  . Stress: Not on file  Relationships  . Social Herbalist on phone: Not on file    Gets together: Not on file    Attends religious service: Not on file    Active member of club or organization: Not on file    Attends meetings of clubs or organizations: Not on file    Relationship status: Not on file  Other Topics Concern  . Not on  file  Social History Narrative   Lives with 2 children   Caffeine use: 1/2 decaf and 1/2 regular coffee per day    Family History  Problem Relation Age of Onset  . Emphysema Mother   . Lung cancer Mother   . Allergies Father   . Emphysema Maternal Grandfather     Health Maintenance  Topic Date Due  . TETANUS/TDAP  10/11/1982  . MAMMOGRAM  10/10/2013  . COLONOSCOPY  10/10/2013  . INFLUENZA VACCINE  01/03/2019  . PAP SMEAR-Modifier  02/18/2019  . Hepatitis C Screening  Completed  . HIV Screening  Completed    ----------------------------------------------------------------------------------------------------------------------------------------------------------------------------------------------------------------- Physical  Exam BP 98/60   Pulse (!) 49   Temp 98.1 F (36.7 C) (Oral)   Ht _0  (1.6 m)   Wt 120 lb (54.4 kg)   SpO2 97%   BMI 21.26 kg/m   Physical Exam Constitutional:      General: She is not in acute distress. HENT:     Head: Normocephalic and atraumatic.     Nose: Nose normal.  Eyes:     General: No scleral icterus.    Conjunctiva/sclera: Conjunctivae normal.  Neck:     Musculoskeletal: Normal range of motion and neck supple.     Thyroid: No thyromegaly.  Cardiovascular:     Rate and Rhythm: Normal rate and regular rhythm.     Heart sounds: Normal heart sounds.  Pulmonary:     Effort: Pulmonary effort is normal.     Breath sounds: Normal breath sounds.  Abdominal:     General: Bowel sounds are normal. There is no distension.     Palpations: Abdomen is soft.     Tenderness: There is no abdominal tenderness. There is no guarding.  Musculoskeletal: Normal range of motion.  Lymphadenopathy:     Cervical: No cervical adenopathy.  Skin:    General: Skin is warm and dry.     Findings: No rash.  Neurological:     Mental Status: She is alert and oriented to person, place, and time.     Cranial Nerves: No cranial nerve deficit.     Coordination: Coordination normal.  Psychiatric:        Behavior: Behavior normal.     ------------------------------------------------------------------------------------------------------------------------------------------------------------------------------------------------------------------- Assessment and Plan  Acute pain of left knee Referral placed to ortho.   Well adult exam Well adult Orders Placed This Encounter  Procedures  . Comp Met (CMET)  . CBC w/Diff  . Lipid panel  . TSH  . Vitamin D (25 hydroxy)  . Ambulatory referral to Orthopedic Surgery    Referral Priority:   Routine    Referral Type:   Surgical    Referral Reason:   Specialty Services Required    Requested Specialty:   Orthopedic Surgery    Number of Visits  Requested:   1  Screening: Lipid Immunizations: UTD Anticipatory guidance/Risk Factor Reduction: Recommendations per AVS.

## 2019-01-16 ENCOUNTER — Encounter: Payer: Self-pay | Admitting: Family Medicine

## 2019-01-16 ENCOUNTER — Telehealth: Payer: Self-pay

## 2019-01-16 NOTE — Telephone Encounter (Signed)
Pt labs in review by Dr. Zigmund Daniel will call once these are released  Copied from [place] [phone]. Topic: General - Other >> Jan 15, 2019  3:31 PM Yvette Rack wrote: Reason for CRM: Pt called for lab results. Pt requests call back

## 2019-01-19 NOTE — Telephone Encounter (Signed)
Pt given results via mychart by Dr. Zigmund Daniel.

## 2019-06-30 ENCOUNTER — Other Ambulatory Visit: Payer: Self-pay | Admitting: Family Medicine

## 2019-12-23 ENCOUNTER — Other Ambulatory Visit: Payer: Self-pay | Admitting: Family Medicine

## 2019-12-30 ENCOUNTER — Other Ambulatory Visit: Payer: Self-pay

## 2019-12-31 ENCOUNTER — Encounter: Payer: Self-pay | Admitting: Nurse Practitioner

## 2019-12-31 ENCOUNTER — Ambulatory Visit: Payer: 59 | Admitting: Nurse Practitioner

## 2019-12-31 VITALS — BP 96/60 | HR 52 | Temp 98.0°F | Ht 62.99 in | Wt 121.8 lb

## 2019-12-31 DIAGNOSIS — Z136 Encounter for screening for cardiovascular disorders: Secondary | ICD-10-CM

## 2019-12-31 DIAGNOSIS — Z13220 Encounter for screening for lipoid disorders: Secondary | ICD-10-CM

## 2019-12-31 DIAGNOSIS — J4520 Mild intermittent asthma, uncomplicated: Secondary | ICD-10-CM | POA: Diagnosis not present

## 2019-12-31 DIAGNOSIS — E559 Vitamin D deficiency, unspecified: Secondary | ICD-10-CM | POA: Diagnosis not present

## 2019-12-31 DIAGNOSIS — Z0000 Encounter for general adult medical examination without abnormal findings: Secondary | ICD-10-CM

## 2019-12-31 LAB — COMPREHENSIVE METABOLIC PANEL
ALT: 15 U/L (ref 0–35)
AST: 26 U/L (ref 0–37)
Albumin: 4.4 g/dL (ref 3.5–5.2)
Alkaline Phosphatase: 60 U/L (ref 39–117)
BUN: 16 mg/dL (ref 6–23)
CO2: 25 mEq/L (ref 19–32)
Calcium: 10.2 mg/dL (ref 8.4–10.5)
Chloride: 107 mEq/L (ref 96–112)
Creatinine, Ser: 0.91 mg/dL (ref 0.40–1.20)
GFR: 63.9 mL/min (ref >60.00)
Glucose, Bld: 85 mg/dL (ref 70–99)
Potassium: 4.7 mEq/L (ref 3.5–5.1)
Sodium: 140 mEq/L (ref 135–145)
Total Bilirubin: 0.6 mg/dL (ref 0.2–1.2)
Total Protein: 6.8 g/dL (ref 6.0–8.3)

## 2019-12-31 LAB — CBC
HCT: 42.7 % (ref 36.0–46.0)
Hemoglobin: 14.2 g/dL (ref 12.0–15.0)
MCHC: 33.3 g/dL (ref 30.0–36.0)
MCV: 91.9 fl (ref 78.0–100.0)
Platelets: 186 10*3/uL (ref 150.0–400.0)
RBC: 4.65 Mil/uL (ref 3.87–5.11)
RDW: 13.4 % (ref 11.5–15.5)
WBC: 4.5 10*3/uL (ref 4.0–10.5)

## 2019-12-31 LAB — LIPID PANEL
Cholesterol: 200 mg/dL (ref 0–200)
HDL: 74.9 mg/dL (ref >39.00)
LDL Cholesterol: 111 mg/dL — ABNORMAL HIGH (ref 0–99)
NonHDL: 125.01
Total CHOL/HDL Ratio: 3
Triglycerides: 71 mg/dL (ref 0.0–149.0)
VLDL: 14.2 mg/dL (ref 0.0–40.0)

## 2019-12-31 LAB — TSH: TSH: 4.09 u[IU]/mL (ref 0.35–4.50)

## 2019-12-31 LAB — VITAMIN D 25 HYDROXY (VIT D DEFICIENCY, FRACTURES): VITD: 45.85 ng/mL (ref 30.00–100.00)

## 2019-12-31 MED ORDER — MONTELUKAST SODIUM 10 MG PO TABS
10.0000 mg | ORAL_TABLET | Freq: Every day | ORAL | 1 refills | Status: DC
Start: 2019-12-31 — End: 2020-04-12

## 2019-12-31 NOTE — Patient Instructions (Addendum)
Go to lab for blood draw.  Please have mammogram, PAP, and bone density results faxed to me.  Preventive Care 56-56 Years Old, Female Preventive care refers to visits with your health care provider and lifestyle choices that can promote health and wellness. This includes:  A yearly physical exam. This may also be called an annual well check.  Regular dental visits and eye exams.  Immunizations.  Screening for certain conditions.  Healthy lifestyle choices, such as eating a healthy diet, getting regular exercise, not using drugs or products that contain nicotine and tobacco, and limiting alcohol use. What can I expect for my preventive care visit? Physical exam Your health care provider will check your:  Height and weight. This may be used to calculate body mass index (BMI), which tells if you are at a healthy weight.  Heart rate and blood pressure.  Skin for abnormal spots. Counseling Your health care provider may ask you questions about your:  Alcohol, tobacco, and drug use.  Emotional well-being.  Home and relationship well-being.  Sexual activity.  Eating habits.  Work and work Statistician.  Method of birth control.  Menstrual cycle.  Pregnancy history. What immunizations do I need?  Influenza (flu) vaccine  This is recommended every year. Tetanus, diphtheria, and pertussis (Tdap) vaccine  You may need a Td booster every 10 years. Varicella (chickenpox) vaccine  You may need this if you have not been vaccinated. Zoster (shingles) vaccine  You may need this after age 12. Measles, mumps, and rubella (MMR) vaccine  You may need at least one dose of MMR if you were born in 1957 or later. You may also need a second dose. Pneumococcal conjugate (PCV13) vaccine  You may need this if you have certain conditions and were not previously vaccinated. Pneumococcal polysaccharide (PPSV23) vaccine  You may need one or two doses if you smoke cigarettes or if you  have certain conditions. Meningococcal conjugate (MenACWY) vaccine  You may need this if you have certain conditions. Hepatitis A vaccine  You may need this if you have certain conditions or if you travel or work in places where you may be exposed to hepatitis A. Hepatitis B vaccine  You may need this if you have certain conditions or if you travel or work in places where you may be exposed to hepatitis B. Haemophilus influenzae type b (Hib) vaccine  You may need this if you have certain conditions. Human papillomavirus (HPV) vaccine  If recommended by your health care provider, you may need three doses over 6 months. You may receive vaccines as individual doses or as more than one vaccine together in one shot (combination vaccines). Talk with your health care provider about the risks and benefits of combination vaccines. What tests do I need? Blood tests  Lipid and cholesterol levels. These may be checked every 5 years, or more frequently if you are over 72 years old.  Hepatitis C test.  Hepatitis B test. Screening  Lung cancer screening. You may have this screening every year starting at age 54 if you have a 30-pack-year history of smoking and currently smoke or have quit within the past 15 years.  Colorectal cancer screening. All adults should have this screening starting at age 12 and continuing until age 68. Your health care provider may recommend screening at age 79 if you are at increased risk. You will have tests every 1-10 years, depending on your results and the type of screening test.  Diabetes screening. This is done  by checking your blood sugar (glucose) after you have not eaten for a while (fasting). You may have this done every 1-3 years.  Mammogram. This may be done every 1-2 years. Talk with your health care provider about when you should start having regular mammograms. This may depend on whether you have a family history of breast cancer.  BRCA-related cancer  screening. This may be done if you have a family history of breast, ovarian, tubal, or peritoneal cancers.  Pelvic exam and Pap test. This may be done every 3 years starting at age 31. Starting at age 51, this may be done every 5 years if you have a Pap test in combination with an HPV test. Other tests  Sexually transmitted disease (STD) testing.  Bone density scan. This is done to screen for osteoporosis. You may have this scan if you are at high risk for osteoporosis. Follow these instructions at home: Eating and drinking  Eat a diet that includes fresh fruits and vegetables, whole grains, lean protein, and low-fat dairy.  Take vitamin and mineral supplements as recommended by your health care provider.  Do not drink alcohol if: ? Your health care provider tells you not to drink. ? You are pregnant, may be pregnant, or are planning to become pregnant.  If you drink alcohol: ? Limit how much you have to 0-1 drink a day. ? Be aware of how much alcohol is in your drink. In the [place] one drink equals one 12 oz bottle of beer (355 mL), one 5 oz glass of wine (148 mL), or one 1 oz glass of hard liquor (44 mL). Lifestyle  Take daily care of your teeth and gums.  Stay active. Exercise for at least 30 minutes on 5 or more days each week.  Do not use any products that contain nicotine or tobacco, such as cigarettes, e-cigarettes, and chewing tobacco. If you need help quitting, ask your health care provider.  If you are sexually active, practice safe sex. Use a condom or other form of birth control (contraception) in order to prevent pregnancy and STIs (sexually transmitted infections).  If told by your health care provider, take low-dose aspirin daily starting at age 36. What's next?  Visit your health care provider once a year for a well check visit.  Ask your health care provider how often you should have your eyes and teeth checked.  Stay up to date on all vaccines. This  information is not intended to replace advice given to you by your health care provider. Make sure you discuss any questions you have with your health care provider. Document Revised: 01/30/2018 Document Reviewed: 01/30/2018 Elsevier Patient Education  2020 [employer].

## 2019-12-31 NOTE — Assessment & Plan Note (Signed)
Stable with use of Singulair. No need for albuterol.  Medication refill sent

## 2019-12-31 NOTE — Assessment & Plan Note (Signed)
Order CBC, TSH, AMP and lipid panel Continue healthy lifestyle Have PAP smear and mammogram results faxed to me. F/up in 96yr

## 2019-12-31 NOTE — Progress Notes (Signed)
Subjective:    Patient ID: Meagan Herrera, female    DOB: 1965/06/07, 54 y.o.   MRN: [id number]  Patient presents today for CPE/transfer of car and medication rfill  HPI Moderate Persistent Asthma Stable with use of Singulair. No need for albuterol.  Medication refill sent  Preventative health care Order CBC, TSH, AMP and lipid panel Continue healthy lifestyle Have PAP smear and mammogram results faxed to me. F/up in 65yr  Sexual History (orientation,birth control, marital status, STD):pelvic and breast exam completed by GYN per patient, up to date with mammogram  Depression/Suicide: Depression screen [facility] 2/9 12/31/2019 01/13/2019  Decreased Interest 0 0  Down, Depressed, Hopeless 0 0  PHQ - 2 Score 0 0   Vision:up to date  Dental:up to date  Immunizations: (TDAP, Hep C screen, Pneumovax, Influenza, zoster)  Health Maintenance  Topic Date Due  . Tetanus Vaccine  Never done  . Mammogram  Never done  . Pap Smear  02/18/2019  . Flu Shot  01/03/2020  . Cologuard (Stool DNA test)  11/01/2020  . COVID-19 Vaccine  Completed  .  Hepatitis C: One time screening is recommended by Center for Disease Control  (CDC) for  adults born from [birth year] through 1965.   Completed  . HIV Screening  Completed   Diet:heart healthy  Weight:  Wt Readings from Last 3 Encounters:  12/31/19 121 lb 12.8 oz (55.2 kg)  01/13/19 120 lb (54.4 kg)  10/13/15 126 lb 8 oz (57.4 kg)   Fall Risk: Fall Risk  12/31/2019  Falls in the past year? 0  Number falls in past yr: 0  Injury with Fall? 0  Follow up Falls evaluation completed   Medications and allergies reviewed with patient and updated if appropriate.  Patient Active Problem List   Diagnosis Date Noted  . Vitamin D insufficiency 12/31/2019  . Preventative health care 01/13/2019  . Acute pain of left knee 01/13/2019  . Moderate Persistent Asthma 08/18/2013  . Allergic rhinitis 08/18/2013   Current Outpatient Medications on File Prior to Visit   Medication Sig Dispense Refill  . Biotin 1 MG CAPS Take by mouth.    . Calcium Carbonate-Vit D-Min (CALTRATE 600+D PLUS PO) Take 1 Dose by mouth daily. 2000 Vit D    . cetirizine (ZYRTEC ALLERGY) 10 MG tablet Take 10 mg by mouth daily.    . Multiple Vitamins-Minerals (SUPER THERA VITE M) TABS Take by mouth.    . Spacer/Aero-Holding Chambers (AEROCHAMBER MV) inhaler Use as instructed 1 each 0   No current facility-administered medications on file prior to visit.    Past Medical History:  Diagnosis Date  . Allergic rhinitis   . Asthma     Past Surgical History:  Procedure Laterality Date  . NO PAST SURGERIES      Social History   Socioeconomic History  . Marital status: Widowed    Spouse name: Not on file  . Number of children: 2  . Years of education: Masters  . Highest education level: Not on file  Occupational History  . Occupation: [employer]  Tobacco Use  . Smoking status: Never Smoker  . Smokeless tobacco: Never Used  Substance and Sexual Activity  . Alcohol use: Yes    Alcohol/week: 0.0 standard drinks    Comment: 0-1 drink per week  . Drug use: No  . Sexual activity: Not on file  Other Topics Concern  . Not on file  Social History Narrative   Lives with 2 children  Caffeine use: 1/2 decaf and 1/2 regular coffee per day   Social Determinants of Health   Financial Resource Strain:   . Difficulty of Paying Living Expenses:   Food Insecurity:   . Worried About Charity fundraiser in the Last Year:   . Arboriculturist in the Last Year:   Transportation Needs:   . Film/video editor (Medical):   Marland Kitchen Lack of Transportation (Non-Medical):   Physical Activity:   . Days of Exercise per Week:   . Minutes of Exercise per Session:   Stress:   . Feeling of Stress :   Social Connections:   . Frequency of Communication with Friends and Family:   . Frequency of Social Gatherings with Friends and Family:   . Attends Religious Services:   . Active  Member of Clubs or Organizations:   . Attends Archivist Meetings:   Marland Kitchen Marital Status:     Family History  Problem Relation Age of Onset  . Emphysema Mother   . Lung cancer Mother   . Allergies Father   . Emphysema Maternal Grandfather         Review of Systems  Constitutional: Negative for fever, malaise/fatigue and weight loss.  HENT: Negative for congestion and sore throat.   Eyes:       Negative for visual changes  Respiratory: Negative for cough and shortness of breath.   Cardiovascular: Negative for chest pain, palpitations and leg swelling.  Gastrointestinal: Negative for blood in stool, constipation, diarrhea and heartburn.  Genitourinary: Negative for dysuria, frequency and urgency.  Musculoskeletal: Negative for falls, joint pain and myalgias.  Skin: Negative for rash.  Neurological: Negative for dizziness, sensory change and headaches.  Endo/Heme/Allergies: Does not bruise/bleed easily.  Psychiatric/Behavioral: Negative for depression, substance abuse and suicidal ideas. The patient is not nervous/anxious.     Objective:   Vitals:   12/31/19 0824  BP: (!) 96/60  Pulse: 52  Temp: 98 F (36.7 C)  SpO2: 98%    Body mass index is 21.58 kg/m.   Physical Examination:  Physical Exam Vitals reviewed.  Constitutional:      General: She is not in acute distress.    Appearance: She is well-developed.  HENT:     Right Ear: Tympanic membrane, ear canal and external ear normal.     Left Ear: Tympanic membrane, ear canal and external ear normal.  Eyes:     Extraocular Movements: Extraocular movements intact.     Conjunctiva/sclera: Conjunctivae normal.  Cardiovascular:     Rate and Rhythm: Normal rate and regular rhythm.     Heart sounds: Normal heart sounds.  Pulmonary:     Effort: Pulmonary effort is normal. No respiratory distress.     Breath sounds: Normal breath sounds.  Chest:     Chest wall: No tenderness.  Abdominal:     General: There  is no distension.     Palpations: Abdomen is soft.  Genitourinary:    Comments: Deferred pelvic and breast exam to GYN per aptient Musculoskeletal:        General: Normal range of motion.     Cervical back: Normal range of motion and neck supple.  Skin:    General: Skin is warm and dry.  Neurological:     Mental Status: She is alert and oriented to person, place, and time.     Deep Tendon Reflexes: Reflexes are normal and symmetric.  Psychiatric:        Mood and  Affect: Mood normal.        Behavior: Behavior normal.        Thought Content: Thought content normal.     ASSESSMENT and PLAN: This visit occurred during the SARS-CoV-2 public health emergency.  Safety protocols were in place, including screening questions prior to the visit, additional usage of staff PPE, and extensive cleaning of exam room while observing appropriate contact time as indicated for disinfecting solutions.   Dru was seen today for transfer of care and medication refill.  Diagnoses and all orders for this visit:  Preventative health care -     CBC -     Comprehensive metabolic panel -     Lipid panel -     TSH  Encounter for lipid screening for cardiovascular disease -     Lipid panel  Vitamin D insufficiency -     VITAMIN D 25 Hydroxy (Vit-D Deficiency, Fractures)  Mild intermittent asthma without complication -     montelukast (SINGULAIR) 10 MG tablet; Take 1 tablet (10 mg total) by mouth daily.        Problem List Items Addressed This Visit      Respiratory   Moderate Persistent Asthma    Stable with use of Singulair. No need for albuterol.  Medication refill sent      Relevant Medications   montelukast (SINGULAIR) 10 MG tablet     Other   Preventative health care - Primary    Order CBC, TSH, AMP and lipid panel Continue healthy lifestyle Have PAP smear and mammogram results faxed to me. F/up in 40yr      Relevant Orders   CBC (Completed)   Comprehensive metabolic panel  (Completed)   Lipid panel (Completed)   TSH (Completed)   Vitamin D insufficiency   Relevant Orders   VITAMIN D 25 Hydroxy (Vit-D Deficiency, Fractures) (Completed)    Other Visit Diagnoses    Encounter for lipid screening for cardiovascular disease       Relevant Orders   Lipid panel (Completed)      Follow up: Return in about 1 year (around 12/30/2020) for CPE (fasting).  Wilfred Lacy, NP

## 2020-02-24 ENCOUNTER — Encounter: Payer: Self-pay | Admitting: Nurse Practitioner

## 2020-02-24 ENCOUNTER — Telehealth (INDEPENDENT_AMBULATORY_CARE_PROVIDER_SITE_OTHER): Payer: 59 | Admitting: Nurse Practitioner

## 2020-02-24 VITALS — Temp 98.9°F | Ht 63.0 in | Wt 121.0 lb

## 2020-02-24 DIAGNOSIS — J209 Acute bronchitis, unspecified: Secondary | ICD-10-CM | POA: Diagnosis not present

## 2020-02-24 MED ORDER — ALBUTEROL SULFATE HFA 108 (90 BASE) MCG/ACT IN AERS: 1.0000 | INHALATION_SPRAY | Freq: Four times a day (QID) | RESPIRATORY_TRACT | 0 refills | Status: DC | PRN

## 2020-02-24 MED ORDER — PREDNISONE 20 MG PO TABS: 20.0000 mg | ORAL_TABLET | Freq: Every day | ORAL | 0 refills | Status: DC

## 2020-02-24 MED ORDER — AZITHROMYCIN 250 MG PO TABS: 250.0000 mg | ORAL_TABLET | Freq: Every day | ORAL | 0 refills | Status: DC

## 2020-02-24 NOTE — Progress Notes (Signed)
Virtual Visit via Video Note  I connected with@ on 02/24/20 at  8:15 AM EDT by a video enabled telemedicine application and verified that I am speaking with the correct person using two identifiers.  Location: Patient:Home Provider: Office Participants: patient and provider  I discussed the limitations of evaluation and management by telemedicine and the availability of in person appointments. I also discussed with the patient that there may be a patient responsible charge related to this service. The patient expressed understanding and agreed to proceed.  CC:Pt c/o chest congestion on and off x2-3 weeks, along with headaches and fatigue. Pt states she was tested for COVID last week and it was negative. Pt states she has been taking vitamins and drinking orange juice to increase her immune systerm but nothing seems to be helping.   History of Present Illness: Cough This is a new problem. The current episode started 1 to 4 weeks ago. The problem has been gradually worsening. The problem occurs constantly. The cough is non-productive. Associated symptoms include shortness of breath and wheezing. Pertinent negatives include no chest pain, chills, fever, headaches, nasal congestion, postnasal drip or rhinorrhea. The symptoms are aggravated by lying down. She has tried OTC cough suppressant for the symptoms. The treatment provided no relief. Her past medical history is significant for asthma and environmental allergies.    Observations/Objective: Physical Exam Vitals reviewed.  Constitutional:      General: She is not in acute distress. Pulmonary:     Effort: Pulmonary effort is normal.  Neurological:     Mental Status: She is alert and oriented to person, place, and time.    Assessment and Plan: Yohana was seen today for acute visit.  Diagnoses and all orders for this visit:  Acute bronchitis, unspecified organism -     azithromycin (ZITHROMAX Z-PAK) 250 MG tablet; Take 1 tablet (250 mg  total) by mouth daily. Take 2tabs on first day, then 1tab once a day till complete -     predniSONE (DELTASONE) 20 MG tablet; Take 1 tablet (20 mg total) by mouth daily with breakfast. -     albuterol (VENTOLIN HFA) 108 (90 Base) MCG/ACT inhaler; Inhale 1-2 puffs into the lungs every 6 (six) hours as needed for wheezing or shortness of breath.   Follow Up Instructions: Call office if no improvement in 1week   I discussed the assessment and treatment plan with the patient. The patient was provided an opportunity to ask questions and all were answered. The patient agreed with the plan and demonstrated an understanding of the instructions.   The patient was advised to call back or seek an in-person evaluation if the symptoms worsen or if the condition fails to improve as anticipated.  Wilfred Lacy, NP

## 2020-02-29 ENCOUNTER — Telehealth: Payer: Self-pay | Admitting: Nurse Practitioner

## 2020-02-29 NOTE — Telephone Encounter (Signed)
Appointment scheduled.  Fatigue, headache after talking to long, neck and shoulder tension.

## 2020-02-29 NOTE — Telephone Encounter (Signed)
During her last video appt, her symptoms were chest congestion. It sounds like these are new symptoms, so please schedule a video appt. Thank you

## 2020-02-29 NOTE — Telephone Encounter (Signed)
Please advise 

## 2020-02-29 NOTE — Telephone Encounter (Signed)
Patient states that she still feels some of her symptoms since her VV last week (low grade fever, neck pain, scratchy throat, congestion). She has finished antibiotics and wants to know what else she can do. Please call her back at [phone] and advise.

## 2020-03-01 ENCOUNTER — Encounter: Payer: Self-pay | Admitting: Nurse Practitioner

## 2020-03-01 ENCOUNTER — Telehealth (INDEPENDENT_AMBULATORY_CARE_PROVIDER_SITE_OTHER): Payer: 59 | Admitting: Nurse Practitioner

## 2020-03-01 VITALS — Temp 98.9°F | Ht 63.0 in | Wt 121.0 lb

## 2020-03-01 DIAGNOSIS — G44209 Tension-type headache, unspecified, not intractable: Secondary | ICD-10-CM | POA: Diagnosis not present

## 2020-03-01 NOTE — Progress Notes (Signed)
Virtual Visit via Video Note  I connected with@ on 03/01/20 at 10:00 AM EDT by a video enabled telemedicine application and verified that I am speaking with the correct person using two identifiers.  Location: Patient:Home Provider: Office Participants: patient and provider  I discussed the limitations of evaluation and management by telemedicine and the availability of in person appointments. I also discussed with the patient that there may be a patient responsible charge related to this service. The patient expressed understanding and agreed to proceed.  CC:Pt c/o headaches if she talks for to long, neck and shoulder tension, and very fatigued.   History of Present Illness: Resolved cough, no SOB or chest pain or fever or nasal congestion   Observations/Objective: Physical Exam Constitutional:      General: She is not in acute distress. Eyes:     Extraocular Movements: Extraocular movements intact.     Conjunctiva/sclera: Conjunctivae normal.  Cardiovascular:     Pulses: Normal pulses.  Pulmonary:     Effort: Pulmonary effort is normal.  Musculoskeletal:     Cervical back: Normal range of motion and neck supple.  Skin:    Findings: No rash.  Neurological:     Mental Status: She is alert and oriented to person, place, and time.    Assessment and Plan: Serrita was seen today for acute visit.  Diagnoses and all orders for this visit:  Tension headache   Follow Up Instructions: Use tylenol or ibuprofen for symptoms. Symptoms are related to change in weather. Schedule F2F appt if symptoms persistent   I discussed the assessment and treatment plan with the patient. The patient was provided an opportunity to ask questions and all were answered. The patient agreed with the plan and demonstrated an understanding of the instructions.   The patient was advised to call back or seek an in-person evaluation if the symptoms worsen or if the condition fails to improve as  anticipated.  Wilfred Lacy, NP

## 2020-04-11 ENCOUNTER — Telehealth: Payer: Self-pay

## 2020-04-11 NOTE — Telephone Encounter (Signed)
Pt called and stated that she was having chest pressure, and some discomfort today.  Pt was transferred to the triage nurse but refuses to go to ER or UC.  Pt was transferred back to office to schedule a appointment with [facility] first available.  Pt refused the 8am appointment but will come in at 8:45am, tomorrow.  Pt also was informed that is symptoms worsen before appointment to please seek evaluation per UC, or ER.  Please see message.

## 2020-04-12 ENCOUNTER — Encounter: Payer: Self-pay | Admitting: Nurse Practitioner

## 2020-04-12 ENCOUNTER — Other Ambulatory Visit: Payer: Self-pay | Admitting: Nurse Practitioner

## 2020-04-12 ENCOUNTER — Ambulatory Visit: Payer: 59 | Admitting: Nurse Practitioner

## 2020-04-12 ENCOUNTER — Ambulatory Visit: Payer: 59

## 2020-04-12 ENCOUNTER — Other Ambulatory Visit: Payer: Self-pay

## 2020-04-12 ENCOUNTER — Ambulatory Visit (INDEPENDENT_AMBULATORY_CARE_PROVIDER_SITE_OTHER): Payer: 59

## 2020-04-12 VITALS — BP 100/60 | HR 58 | Temp 97.4°F | Ht 62.0 in | Wt 123.0 lb

## 2020-04-12 DIAGNOSIS — F418 Other specified anxiety disorders: Secondary | ICD-10-CM | POA: Diagnosis not present

## 2020-04-12 DIAGNOSIS — J4520 Mild intermittent asthma, uncomplicated: Secondary | ICD-10-CM

## 2020-04-12 DIAGNOSIS — R0789 Other chest pain: Secondary | ICD-10-CM | POA: Diagnosis not present

## 2020-04-12 DIAGNOSIS — J4521 Mild intermittent asthma with (acute) exacerbation: Secondary | ICD-10-CM

## 2020-04-12 DIAGNOSIS — R4589 Other symptoms and signs involving emotional state: Secondary | ICD-10-CM | POA: Insufficient documentation

## 2020-04-12 MED ORDER — BUDESONIDE-FORMOTEROL FUMARATE 80-4.5 MCG/ACT IN AERO
2.0000 | INHALATION_SPRAY | Freq: Two times a day (BID) | RESPIRATORY_TRACT | 12 refills | Status: DC
Start: 2020-04-12 — End: 2020-07-06

## 2020-04-12 NOTE — Patient Instructions (Signed)
Go to lab for CXR

## 2020-04-12 NOTE — Assessment & Plan Note (Signed)
I think symptoms are triggered by increase anxiety. Provided with peak flow meter today. Get CXR Start symbicort Continue montelukast daily and albuterol prn

## 2020-04-12 NOTE — Addendum Note (Signed)
Addended by: Lynnea Ferrier on: 04/12/2020 02:43 PM   Modules accepted: Orders

## 2020-04-12 NOTE — Addendum Note (Signed)
Addended by: Lynnea Ferrier on: 04/12/2020 02:52 PM   Modules accepted: Orders

## 2020-04-12 NOTE — Progress Notes (Signed)
Subjective:  Patient ID: Meagan Herrera, female    DOB: [birth year]  Age: 54 y.o. MRN: [id number]  CC: Acute Visit (Pt c/o lingering bronchitis, pt states she has chest pressure that comes and goes along with fatigue. This weekend she noticed it but today it is not there. Pt states this has been coming and going for months. She was tested for COVID 03/29/2020 and it was negative. )  Asthma She complains of chest tightness, difficulty breathing, frequent throat clearing and wheezing. There is no cough, hemoptysis, hoarse voice, shortness of breath or sputum production. This is a recurrent problem. The current episode started more than 1 month ago. The problem occurs intermittently. The problem has been waxing and waning. Associated symptoms include chest pain and malaise/fatigue. Pertinent negatives include no appetite change, dyspnea on exertion, fever, nasal congestion, orthopnea, PND, sweats or weight loss. Her symptoms are aggravated by emotional stress and change in weather. Her symptoms are alleviated by beta-agonist. She reports minimal improvement on treatment. Her past medical history is significant for asthma.   Wt Readings from Last 3 Encounters:  04/12/20 123 lb (55.8 kg)  03/01/20 121 lb (54.9 kg)  02/24/20 121 lb (54.9 kg)   Reviewed past Medical, Social and Family history today.  Outpatient Medications Prior to Visit  Medication Sig Dispense Refill   albuterol (VENTOLIN HFA) 108 (90 Base) MCG/ACT inhaler Inhale 1-2 puffs into the lungs every 6 (six) hours as needed for wheezing or shortness of breath. 8 g 0   Biotin 1 MG CAPS Take by mouth.     Calcium Carbonate-Vit D-Min (CALTRATE 600+D PLUS PO) Take 1 Dose by mouth daily. 2000 Vit D     cetirizine (ZYRTEC ALLERGY) 10 MG tablet Take 10 mg by mouth daily.     montelukast (SINGULAIR) 10 MG tablet TAKE 1 TABLET(10 MG) BY MOUTH DAILY 90 tablet 1   Multiple Vitamins-Minerals (SUPER THERA VITE M) TABS Take by mouth.      Spacer/Aero-Holding Chambers (AEROCHAMBER MV) inhaler Use as instructed (Patient not taking: Reported on 04/12/2020) 1 each 0   No facility-administered medications prior to visit.    ROS See HPI  Objective:  BP 100/60 (BP Location: Left Arm, Patient Position: Sitting, Cuff Size: Normal)    Pulse (!) 58    Temp (!) 97.4 F (36.3 C) (Temporal)    Ht 5\' 2"  (1.575 m)    Wt 123 lb (55.8 kg)    LMP 08/11/2013    SpO2 100%    BMI 22.50 kg/m   Physical Exam Constitutional:      General: She is not in acute distress. Cardiovascular:     Rate and Rhythm: Normal rate and regular rhythm.     Pulses: Normal pulses.     Heart sounds: Normal heart sounds.  Pulmonary:     Effort: Pulmonary effort is normal.     Breath sounds: Normal breath sounds.  Musculoskeletal:     Cervical back: Normal range of motion and neck supple.  Neurological:     Mental Status: She is alert and oriented to person, place, and time.  Psychiatric:        Attention and Perception: Attention normal.        Mood and Affect: Mood is anxious.        Speech: Speech normal.        Behavior: Behavior normal.        Thought Content: Thought content normal.        Cognition and  Memory: Cognition normal.    Assessment & Plan:  This visit occurred during the SARS-CoV-2 public health emergency.  Safety protocols were in place, including screening questions prior to the visit, additional usage of staff PPE, and extensive cleaning of exam room while observing appropriate contact time as indicated for disinfecting solutions.   Meagan Herrera was seen today for acute visit.  Diagnoses and all orders for this visit:  Mild intermittent asthma with acute exacerbation -     DG Chest 2 View -     budesonide-formoterol (SYMBICORT) 80-4.5 MCG/ACT inhaler; Inhale 2 puffs into the lungs 2 (two) times daily. Rinse mouth after each use -     Peak flow meter  Sensation of chest tightness -     DG Chest 2 View -     budesonide-formoterol  (SYMBICORT) 80-4.5 MCG/ACT inhaler; Inhale 2 puffs into the lungs 2 (two) times daily. Rinse mouth after each use  Anxiety about health   Problem List Items Addressed This Visit      Respiratory   Asthma - Primary    I think symptoms are triggered by increase anxiety. Provided with peak flow meter today. Get CXR Start symbicort Continue montelukast daily and albuterol prn      Relevant Medications   budesonide-formoterol (SYMBICORT) 80-4.5 MCG/ACT inhaler   Other Relevant Orders   DG Chest 2 View   Peak flow meter     Other   Anxiety about health    Related to husband's death in 09-01-2013.(HIV/AIDs complication) Participating in counseling once a month. Reports she had 4 HIV negative tests-last test in 09-02-14, but still concerned about possible HIV infection. She has not been sexually active since death of her husband. I explained symptoms of HIV and AIDs, incubation period after initial infection, timeframe it takes to become seropositive.She verbalized understanding. Advised to continue sessions with her therapist.       Other Visit Diagnoses    Sensation of chest tightness       Relevant Medications   budesonide-formoterol (SYMBICORT) 80-4.5 MCG/ACT inhaler   Other Relevant Orders   DG Chest 2 View      Follow-up: Return in about 4 weeks (around 05/10/2020) for asthma.  Wilfred Lacy, NP

## 2020-04-12 NOTE — Assessment & Plan Note (Signed)
Related to husband's death in Sep 09, 2013.(HIV/AIDs complication) Participating in counseling once a month. Reports she had 4 HIV negative tests-last test in 10-Sep-2014, but still concerned about possible HIV infection. She has not been sexually active since death of her husband. I explained symptoms of HIV and AIDs, incubation period after initial infection, timeframe it takes to become seropositive.She verbalized understanding. Advised to continue sessions with her therapist.

## 2020-07-06 ENCOUNTER — Encounter: Payer: Self-pay | Admitting: Nurse Practitioner

## 2020-07-06 ENCOUNTER — Telehealth (INDEPENDENT_AMBULATORY_CARE_PROVIDER_SITE_OTHER): Payer: 59 | Admitting: Nurse Practitioner

## 2020-07-06 VITALS — Temp 99.5°F | Ht 63.0 in | Wt 121.0 lb

## 2020-07-06 DIAGNOSIS — B349 Viral infection, unspecified: Secondary | ICD-10-CM | POA: Diagnosis not present

## 2020-07-06 NOTE — Progress Notes (Signed)
Virtual Visit via Video Note  I connected with@ on 07/06/20 at  8:15 AM EST by a video enabled telemedicine application and verified that I am speaking with the correct person using two identifiers.  Location: Patient:Home Provider: Office Participants: patient and provider  I discussed the limitations of evaluation and management by telemedicine and the availability of in person appointments. I also discussed with the patient that there may be a patient responsible charge related to this service. The patient expressed understanding and agreed to proceed.  CC:Pt states she test positive for COVID on 06/06/20 and states she was feeling better after about 10 days but now she is experiencing fatigue, headaches, and a pressure in her upper chest. Pt took a rapid COVID test at work yesterday that was negative.   History of Present Illness: URI  This is a new problem. The current episode started in the past 7 days. The problem has been unchanged. There has been no fever. Associated symptoms include headaches. Pertinent negatives include no abdominal pain, chest pain, congestion, coughing, diarrhea, dysuria, ear pain, joint pain, joint swelling, nausea, neck pain, plugged ear sensation, rash, rhinorrhea, sinus pain, sneezing, sore throat, swollen glands, vomiting or wheezing. Associated symptoms comments: fatigue. She has tried acetaminophen for the symptoms. The treatment provided no relief.    Observations/Objective: Physical Exam Vitals reviewed.  Constitutional:      General: She is not in acute distress.    Appearance: She is not ill-appearing.  Cardiovascular:     Rate and Rhythm: Normal rate.     Pulses: Normal pulses.  Pulmonary:     Effort: Pulmonary effort is normal.  Neurological:     Mental Status: She is alert and oriented to person, place, and time.    Assessment and Plan: Crystalmarie was seen today for acute visit.  Diagnoses and all orders for this visit:  Acute viral  syndrome   Follow Up Instructions: Symptom management recommended: adequate sleep, hydration, and nutrition. Stay active. Use albuterol as prescribed.  I discussed the assessment and treatment plan with the patient. The patient was provided an opportunity to ask questions and all were answered. The patient agreed with the plan and demonstrated an understanding of the instructions.   The patient was advised to call back or seek an in-person evaluation if the symptoms worsen or if the condition fails to improve as anticipated.  I provided 20 minutes of non-face-to-face time during this encounter.  Wilfred Lacy, NP

## 2020-09-28 ENCOUNTER — Other Ambulatory Visit: Payer: Self-pay | Admitting: Nurse Practitioner

## 2020-09-28 DIAGNOSIS — J209 Acute bronchitis, unspecified: Secondary | ICD-10-CM

## 2020-10-09 ENCOUNTER — Other Ambulatory Visit: Payer: Self-pay | Admitting: Nurse Practitioner

## 2020-10-09 DIAGNOSIS — J4520 Mild intermittent asthma, uncomplicated: Secondary | ICD-10-CM

## 2021-03-09 ENCOUNTER — Ambulatory Visit: Payer: 59 | Admitting: Nurse Practitioner

## 2021-03-23 ENCOUNTER — Encounter: Payer: Self-pay | Admitting: Nurse Practitioner

## 2021-03-23 ENCOUNTER — Other Ambulatory Visit: Payer: Self-pay

## 2021-03-23 ENCOUNTER — Ambulatory Visit: Payer: 59 | Admitting: Nurse Practitioner

## 2021-03-23 VITALS — BP 106/60 | HR 60 | Temp 97.3°F | Ht 62.0 in | Wt 123.8 lb

## 2021-03-23 DIAGNOSIS — J4520 Mild intermittent asthma, uncomplicated: Secondary | ICD-10-CM

## 2021-03-23 DIAGNOSIS — Z13220 Encounter for screening for lipoid disorders: Secondary | ICD-10-CM | POA: Diagnosis not present

## 2021-03-23 DIAGNOSIS — Z136 Encounter for screening for cardiovascular disorders: Secondary | ICD-10-CM | POA: Diagnosis not present

## 2021-03-23 DIAGNOSIS — Z0000 Encounter for general adult medical examination without abnormal findings: Secondary | ICD-10-CM | POA: Diagnosis not present

## 2021-03-23 DIAGNOSIS — Z23 Encounter for immunization: Secondary | ICD-10-CM

## 2021-03-23 LAB — LIPID PANEL
Cholesterol: 194 mg/dL (ref 0–200)
HDL: 74.7 mg/dL (ref >39.00)
LDL Cholesterol: 102 mg/dL — ABNORMAL HIGH (ref 0–99)
NonHDL: 118.88
Total CHOL/HDL Ratio: 3
Triglycerides: 84 mg/dL (ref 0.0–149.0)
VLDL: 16.8 mg/dL (ref 0.0–40.0)

## 2021-03-23 LAB — COMPREHENSIVE METABOLIC PANEL
ALT: 14 U/L (ref 0–35)
AST: 23 U/L (ref 0–37)
Albumin: 4.4 g/dL (ref 3.5–5.2)
Alkaline Phosphatase: 68 U/L (ref 39–117)
BUN: 16 mg/dL (ref 6–23)
CO2: 25 mEq/L (ref 19–32)
Calcium: 9.8 mg/dL (ref 8.4–10.5)
Chloride: 107 mEq/L (ref 96–112)
Creatinine, Ser: 0.81 mg/dL (ref 0.40–1.20)
GFR: 80.56 mL/min (ref >60.00)
Glucose, Bld: 83 mg/dL (ref 70–99)
Potassium: 4.7 mEq/L (ref 3.5–5.1)
Sodium: 142 mEq/L (ref 135–145)
Total Bilirubin: 0.6 mg/dL (ref 0.2–1.2)
Total Protein: 6.6 g/dL (ref 6.0–8.3)

## 2021-03-23 LAB — CBC
HCT: 41.1 % (ref 36.0–46.0)
Hemoglobin: 13.7 g/dL (ref 12.0–15.0)
MCHC: 33.2 g/dL (ref 30.0–36.0)
MCV: 92.3 fl (ref 78.0–100.0)
Platelets: 191 10*3/uL (ref 150.0–400.0)
RBC: 4.45 Mil/uL (ref 3.87–5.11)
RDW: 13.2 % (ref 11.5–15.5)
WBC: 4.6 10*3/uL (ref 4.0–10.5)

## 2021-03-23 LAB — TSH: TSH: 4.44 u[IU]/mL (ref 0.35–5.50)

## 2021-03-23 MED ORDER — MONTELUKAST SODIUM 10 MG PO TABS: ORAL_TABLET | ORAL | 3 refills | Status: DC

## 2021-03-23 NOTE — Addendum Note (Signed)
Addended by: Renette Butters on: 03/23/2021 10:17 AM   Modules accepted: Orders

## 2021-03-23 NOTE — Progress Notes (Signed)
Subjective:    Patient ID: Meagan Herrera, female    DOB: 1966-03-19, 55 y.o.   MRN: [id number]  Patient presents today for CPE   HPI  Vision:completed annually Dental:completed annually Diet:regular Exercise:cardio and strength training Weight:  Wt Readings from Last 3 Encounters:  03/23/21 123 lb 12.8 oz (56.2 kg)  07/06/20 121 lb (54.9 kg)  04/12/20 123 lb (55.8 kg)   Sexual History (orientation,birth control, marital status, STD):agreed to Breast exam today, deferred pelvic exam to GYN, has mammogram schedule for next week  Depression/Suicide: Depression screen [facility] 2/9 03/23/2021 12/31/2019 01/13/2019  Decreased Interest 0 0 0  Down, Depressed, Hopeless 0 0 0  PHQ - 2 Score 0 0 0  Altered sleeping 0 - -  Tired, decreased energy 0 - -  Change in appetite 0 - -  Feeling bad or failure about yourself  0 - -  Trouble concentrating 0 - -  Moving slowly or fidgety/restless 0 - -  Suicidal thoughts 0 - -  PHQ-9 Score 0 - -   Immunizations: (TDAP, Hep C screen, Pneumovax, Influenza, zoster)  Health Maintenance  Topic Date Due   Tetanus Vaccine  Never done   Mammogram  Never done   Zoster (Shingles) Vaccine (1 of 2) Never done   Pap Smear  02/18/2019   COVID-19 Vaccine (3 - Booster for Pfizer series) 10/29/2019   Cologuard (Stool DNA test)  11/01/2020   Flu Shot  09/01/2021*   Pneumococcal Vaccination (2 - PCV) 03/23/2022*   Hepatitis C Screening: USPSTF Recommendation to screen - Ages 84-79 yo.  Completed   HIV Screening  Completed   HPV Vaccine  Aged Out  *Topic was postponed. The date shown is not the original due date.   Fall Risk: Fall Risk  03/23/2021 04/12/2020 12/31/2019  Falls in the past year? 0 0 0  Number falls in past yr: 0 - 0  Injury with Fall? 0 - 0  Risk for fall due to : No Fall Risks - -  Follow up Falls evaluation completed - Falls evaluation completed   Medications and allergies reviewed with patient and updated if appropriate.  Patient Active  Problem List   Diagnosis Date Noted   Anxiety about health 04/12/2020   Vitamin D insufficiency 12/31/2019   Preventative health care 01/13/2019   Impingement syndrome of right shoulder region 10/06/2018   Asthma 08/18/2013   Atopic rhinitis 08/18/2013    Current Outpatient Medications on File Prior to Visit  Medication Sig Dispense Refill   albuterol (VENTOLIN HFA) 108 (90 Base) MCG/ACT inhaler INHALE 1 TO 2 PUFFS INTO THE LUNGS EVERY 6 HOURS AS NEEDED FOR WHEEZING OR SHORTNESS OF BREATH 6.7 g 0   Calcium Carbonate-Vit D-Min (CALTRATE 600+D PLUS PO) Take 1 Dose by mouth daily. 2000 Vit D     cetirizine (ZYRTEC) 10 MG tablet Take 10 mg by mouth daily.     Multiple Vitamins-Minerals (SUPER THERA VITE M) TABS Take by mouth.     No current facility-administered medications on file prior to visit.    Past Medical History:  Diagnosis Date   Allergic rhinitis    Asthma     Past Surgical History:  Procedure Laterality Date   NO PAST SURGERIES      Social History   Socioeconomic History   Marital status: Widowed    Spouse name: Not on file   Number of children: 2   Years of education: Masters   Highest education level: Not on file  Occupational History   Occupation: Engineer, manufacturing  Tobacco Use   Smoking status: Never   Smokeless tobacco: Never  Substance and Sexual Activity   Alcohol use: Yes    Alcohol/week: 0.0 standard drinks    Comment: 0-1 drink per week   Drug use: No   Sexual activity: Not Currently    Birth control/protection: Abstinence  Other Topics Concern   Not on file  Social History Narrative   Lives with 2 children   Caffeine use: 1/2 decaf and 1/2 regular coffee per day   Social Determinants of Health   Financial Resource Strain: Not on file  Food Insecurity: Not on file  Transportation Needs: Not on file  Physical Activity: Not on file  Stress: Not on file  Social Connections: Not on file    Family History  Problem Relation Age of Onset    Emphysema Mother    Lung cancer Mother    Allergies Father    Emphysema Maternal Grandfather         Review of Systems  Constitutional:  Negative for fever, malaise/fatigue and weight loss.  HENT:  Negative for congestion and sore throat.   Eyes:        Negative for visual changes  Respiratory:  Negative for cough and shortness of breath.   Cardiovascular:  Negative for chest pain, palpitations and leg swelling.  Gastrointestinal:  Negative for blood in stool, constipation, diarrhea and heartburn.  Genitourinary:  Negative for dysuria, frequency and urgency.  Musculoskeletal:  Negative for falls, joint pain and myalgias.  Skin:  Negative for rash.  Neurological:  Negative for dizziness, sensory change and headaches.  Endo/Heme/Allergies:  Does not bruise/bleed easily.  Psychiatric/Behavioral:  Negative for depression, substance abuse and suicidal ideas. The patient is not nervous/anxious.    Objective:   Vitals:   03/23/21 0827  BP: 106/60  Pulse: 60  Temp: (!) 97.3 F (36.3 C)  SpO2: 99%   Body mass index is 22.64 kg/m.  Physical Examination:  Physical Exam Vitals reviewed.  Constitutional:      General: She is not in acute distress.    Appearance: She is well-developed.  HENT:     Right Ear: Tympanic membrane, ear canal and external ear normal.     Left Ear: Tympanic membrane, ear canal and external ear normal.  Eyes:     Extraocular Movements: Extraocular movements intact.     Conjunctiva/sclera: Conjunctivae normal.  Cardiovascular:     Rate and Rhythm: Normal rate and regular rhythm.     Heart sounds: Normal heart sounds.  Pulmonary:     Effort: Pulmonary effort is normal. No respiratory distress.     Breath sounds: Normal breath sounds.  Chest:     Chest wall: No tenderness.  Breasts:    Breasts are symmetrical.     Right: Normal.     Left: Normal.  Abdominal:     General: Bowel sounds are normal.     Palpations: Abdomen is soft.   Musculoskeletal:        General: Normal range of motion.     Cervical back: Normal range of motion and neck supple.     Right lower leg: No edema.     Left lower leg: No edema.  Lymphadenopathy:     Upper Body:     Right upper body: No supraclavicular, axillary or pectoral adenopathy.     Left upper body: No supraclavicular, axillary or pectoral adenopathy.  Skin:    General: Skin is  warm and dry.  Neurological:     Mental Status: She is alert and oriented to person, place, and time.     Deep Tendon Reflexes: Reflexes are normal and symmetric.  Psychiatric:        Mood and Affect: Mood normal.        Behavior: Behavior normal.        Thought Content: Thought content normal.    ASSESSMENT and PLAN: This visit occurred during the SARS-CoV-2 public health emergency.  Safety protocols were in place, including screening questions prior to the visit, additional usage of staff PPE, and extensive cleaning of exam room while observing appropriate contact time as indicated for disinfecting solutions.   Lilibeth was seen today for annual exam.  Diagnoses and all orders for this visit:  Preventative health care -     Comprehensive metabolic panel -     Lipid panel -     TSH -     CBC  Encounter for lipid screening for cardiovascular disease -     Lipid panel  Mild intermittent asthma without complication -     montelukast (SINGULAIR) 10 MG tablet; TAKE 1 TABLET(10 MG) BY MOUTH DAILY    Please appt with GYN for repeat PAP. Have report faxed to me You need to repeat cologuard test for colon cancer screen. Have report faxed to me Have mammogram report faxed to me once completed. Vitamin D level for last 2years have been normal, no need for repeat today.  Continue calcium 600mg  and vitamin D 1000IU daily for bone health.  Problem List Items Addressed This Visit       Respiratory   Asthma   Relevant Medications   montelukast (SINGULAIR) 10 MG tablet     Other   Preventative  health care - Primary   Relevant Orders   Comprehensive metabolic panel   Lipid panel   TSH   CBC   Other Visit Diagnoses     Encounter for lipid screening for cardiovascular disease       Relevant Orders   Lipid panel       Follow up: Return in about 1 year (around 03/23/2022) for CPE (fasting).  Wilfred Lacy, NP

## 2021-03-23 NOTE — Patient Instructions (Addendum)
Please appt with GYN for repeat PAP. Have report faxed to me You need to repeat cologuard test for colon cancer screen. Have report faxed to me Have mammogram report faxed to me once completed. Vitamin D level for last 2years have been normal, no need for repeat today.  Continue calcium 600mg and vitamin D 1000IU daily for bone health  Go to lab for blood draw.  Preventive Care 40-57 Years Old, Female Preventive care refers to lifestyle choices and visits with your health care provider that can promote health and wellness. This includes: A yearly physical exam. This is also called an annual wellness visit. Regular dental and eye exams. Immunizations. Screening for certain conditions. Healthy lifestyle choices, such as: Eating a healthy diet. Getting regular exercise. Not using drugs or products that contain nicotine and tobacco. Limiting alcohol use. What can I expect for my preventive care visit? Physical exam Your health care provider will check your: Height and weight. These may be used to calculate your BMI (body mass index). BMI is a measurement that tells if you are at a healthy weight. Heart rate and blood pressure. Body temperature. Skin for abnormal spots. Counseling Your health care provider may ask you questions about your: Past medical problems. Family's medical history. Alcohol, tobacco, and drug use. Emotional well-being. Home life and relationship well-being. Sexual activity. Diet, exercise, and sleep habits. Work and work environment. Access to firearms. Method of birth control. Menstrual cycle. Pregnancy history. What immunizations do I need? Vaccines are usually given at various ages, according to a schedule. Your health care provider will recommend vaccines for you based on your age, medical history, and lifestyle or other factors, such as travel or where you work. What tests do I need? Blood tests Lipid and cholesterol levels. These may be checked every  5 years, or more often if you are over 50 years old. Hepatitis C test. Hepatitis B test. Screening Lung cancer screening. You may have this screening every year starting at age 55 if you have a 30-pack-year history of smoking and currently smoke or have quit within the past 15 years. Colorectal cancer screening. All adults should have this screening starting at age 50 and continuing until age 75. Your health care provider may recommend screening at age 45 if you are at increased risk. You will have tests every 1-10 years, depending on your results and the type of screening test. Diabetes screening. This is done by checking your blood sugar (glucose) after you have not eaten for a while (fasting). You may have this done every 1-3 years. Mammogram. This may be done every 1-2 years. Talk with your health care provider about when you should start having regular mammograms. This may depend on whether you have a family history of breast cancer. BRCA-related cancer screening. This may be done if you have a family history of breast, ovarian, tubal, or peritoneal cancers. Pelvic exam and Pap test. This may be done every 3 years starting at age 21. Starting at age 30, this may be done every 5 years if you have a Pap test in combination with an HPV test. Other tests STD (sexually transmitted disease) testing, if you are at risk. Bone density scan. This is done to screen for osteoporosis. You may have this scan if you are at high risk for osteoporosis. Talk with your health care provider about your test results, treatment options, and if necessary, the need for more tests. Follow these instructions at home: Eating and drinking  Eat   a diet that includes fresh fruits and vegetables, whole grains, lean protein, and low-fat dairy products. Take vitamin and mineral supplements as recommended by your health care provider. Do not drink alcohol if: Your health care provider tells you not to drink. You are  pregnant, may be pregnant, or are planning to become pregnant. If you drink alcohol: Limit how much you have to 0-1 drink a day. Be aware of how much alcohol is in your drink. In the [place] one drink equals one 12 oz bottle of beer (355 mL), one 5 oz glass of wine (148 mL), or one 1 oz glass of hard liquor (44 mL). Lifestyle Take daily care of your teeth and gums. Brush your teeth every morning and night with fluoride toothpaste. Floss one time each day. Stay active. Exercise for at least 30 minutes 5 or more days each week. Do not use any products that contain nicotine or tobacco, such as cigarettes, e-cigarettes, and chewing tobacco. If you need help quitting, ask your health care provider. Do not use drugs. If you are sexually active, practice safe sex. Use a condom or other form of protection to prevent STIs (sexually transmitted infections). If you do not wish to become pregnant, use a form of birth control. If you plan to become pregnant, see your health care provider for a prepregnancy visit. If told by your health care provider, take low-dose aspirin daily starting at age 50. Find healthy ways to cope with stress, such as: Meditation, yoga, or listening to music. Journaling. Talking to a trusted person. Spending time with friends and family. Safety Always wear your seat belt while driving or riding in a vehicle. Do not drive: If you have been drinking alcohol. Do not ride with someone who has been drinking. When you are tired or distracted. While texting. Wear a helmet and other protective equipment during sports activities. If you have firearms in your house, make sure you follow all gun safety procedures. What's next? Visit your health care provider once a year for an annual wellness visit. Ask your health care provider how often you should have your eyes and teeth checked. Stay up to date on all vaccines. This information is not intended to replace advice given to you by your  health care provider. Make sure you discuss any questions you have with your health care provider. Document Revised: 07/29/2020 Document Reviewed: 01/30/2018 Elsevier Patient Education  2022 Elsevier Inc.  

## 2021-03-31 LAB — HM MAMMOGRAPHY

## 2021-04-13 LAB — COLOGUARD: COLOGUARD: NEGATIVE

## 2021-05-24 ENCOUNTER — Other Ambulatory Visit: Payer: Self-pay

## 2021-05-24 ENCOUNTER — Ambulatory Visit: Payer: 59 | Admitting: Nurse Practitioner

## 2021-05-24 ENCOUNTER — Encounter: Payer: Self-pay | Admitting: Nurse Practitioner

## 2021-05-24 VITALS — BP 102/64 | HR 60 | Temp 96.5°F | Ht 62.0 in | Wt 124.6 lb

## 2021-05-24 DIAGNOSIS — J0140 Acute pansinusitis, unspecified: Secondary | ICD-10-CM | POA: Diagnosis not present

## 2021-05-24 NOTE — Progress Notes (Signed)
Subjective:  Patient ID: Meagan Herrera, female    DOB: Apr 23, 1966  Age: 55 y.o. MRN: [id number]  CC: Acute Visit (Pt c/o fatigue and congestion x 2 weeks. /Pt states she has took 2 home COVID test that were negative, last one took on yesterday 05/23/21)  Sinusitis This is a new problem. The current episode started 1 to 4 weeks ago. The problem is unchanged. There has been no fever. Associated symptoms include congestion, coughing, headaches, sinus pressure, sneezing and swollen glands. Pertinent negatives include no chills, diaphoresis, ear pain, hoarse voice, neck pain, shortness of breath or sore throat. Past treatments include antibiotics, acetaminophen and oral decongestants. The treatment provided mild relief.  Started doxycycline 05/20/2021. Received 3covid vaccine doses and influenza vaccine 03/2021. Reports possible sick contact at conference 1week prior to symptoms onset.  Reviewed past Medical, Social and Family history today.  Outpatient Medications Prior to Visit  Medication Sig Dispense Refill   albuterol (VENTOLIN HFA) 108 (90 Base) MCG/ACT inhaler INHALE 1 TO 2 PUFFS INTO THE LUNGS EVERY 6 HOURS AS NEEDED FOR WHEEZING OR SHORTNESS OF BREATH 6.7 g 0   Calcium Carbonate-Vit D-Min (CALTRATE 600+D PLUS PO) Take 1 Dose by mouth daily. 2000 Vit D     cetirizine (ZYRTEC) 10 MG tablet Take 10 mg by mouth daily.     doxycycline (MONODOX) 100 MG capsule Take 100 mg by mouth 2 (two) times daily.     montelukast (SINGULAIR) 10 MG tablet TAKE 1 TABLET(10 MG) BY MOUTH DAILY 90 tablet 3   Multiple Vitamins-Minerals (SUPER THERA VITE M) TABS Take by mouth.     No facility-administered medications prior to visit.   ROS See HPI  Objective:  BP 102/64 (BP Location: Left Arm, Patient Position: Sitting, Cuff Size: Normal)    Pulse 60    Temp (!) 96.5 F (35.8 C) (Temporal)    Ht 5\' 2"  (1.575 m)    Wt 124 lb 9.6 oz (56.5 kg)    LMP 08/11/2013    SpO2 98%    BMI 22.79 kg/m   Physical  Exam Cardiovascular:     Rate and Rhythm: Normal rate and regular rhythm.     Pulses: Normal pulses.     Heart sounds: Normal heart sounds.  Pulmonary:     Effort: Pulmonary effort is normal.     Breath sounds: Normal breath sounds.  Musculoskeletal:     Cervical back: Normal range of motion and neck supple.     Right lower leg: No edema.     Left lower leg: No edema.  Lymphadenopathy:     Cervical: No cervical adenopathy.  Skin:    Findings: No rash.  Neurological:     Mental Status: She is alert and oriented to person, place, and time.   Assessment & Plan:  This visit occurred during the SARS-CoV-2 public health emergency.  Safety protocols were in place, including screening questions prior to the visit, additional usage of staff PPE, and extensive cleaning of exam room while observing appropriate contact time as indicated for disinfecting solutions.   [name] was seen today for acute visit.  Diagnoses and all orders for this visit:  Acute non-recurrent pansinusitis  Complete doxycycline as prescribed Ok to continue mucinex DM or Robitussin or Delsym as needed for cough. Ok to add flonase and saline nasal spray for nasal congestion.  Problem List Items Addressed This Visit   None Visit Diagnoses     Acute non-recurrent pansinusitis    -  Primary  Relevant Medications   doxycycline (MONODOX) 100 MG capsule       Follow-up: Return if symptoms worsen or fail to improve.  Wilfred Lacy, NP

## 2021-05-24 NOTE — Patient Instructions (Signed)
Complete doxycycline as prescribed Ok to continue mucinex DM or Robitussin or Delsym as needed for cough. Ok to add flonase and saline nasal spray for nasal congestion.  Viral Respiratory Infection A respiratory infection is an illness that affects part of the respiratory system, such as the lungs, nose, or throat. A respiratory infection that is caused by a virus is called a viral respiratory infection. Common types of viral respiratory infections include: A cold. The flu (influenza). A respiratory syncytial virus (RSV) infection. What are the causes? This condition is caused by a virus. The virus may spread through contact with droplets or direct contact with infected people or their mucus or secretions. The virus may spread from person to person (is contagious). What are the signs or symptoms? Symptoms of this condition include: A stuffy or runny nose. A sore throat or cough. Shortness of breath or difficulty breathing. Yellow or green mucus (sputum). Other symptoms may include: A fever. Sweating or chills. Fatigue. Achy muscles. A headache. How is this diagnosed? This condition may be diagnosed based on: Your symptoms. A physical exam. Testing of secretions from the nose or throat. Chest X-ray. How is this treated? This condition may be treated with medicines, such as: Antiviral medicine. This may shorten the length of time a person has symptoms. Expectorants. These make it easier to cough up mucus. Decongestant nasal sprays. Acetaminophen or NSAIDs, such as ibuprofen, to relieve fever and pain. Antibiotic medicines are not prescribed for viral infections.This is because antibiotics are designed to kill bacteria. They do not kill viruses. Follow these instructions at home: Managing pain and congestion Take over-the-counter and prescription medicines only as told by your health care provider. If you have a sore throat, gargle with a mixture of salt and water 3-4 times a day  or as needed. To make salt water, completely dissolve -1 tsp (3-6 g) of salt in 1 cup (237 mL) of warm water. Use nose drops made from salt water to ease congestion and soften raw skin around your nose. Take 2 tsp (10 mL) of honey at bedtime to lessen coughing at night. Do not give honey to children who are younger than 1 year. Drink enough fluid to keep your urine pale yellow. This helps prevent dehydration and helps loosen up mucus. General instructions  Rest as much as possible. Do not drink alcohol. Do not use any products that contain nicotine or tobacco. These products include cigarettes, chewing tobacco, and vaping devices, such as e-cigarettes. If you need help quitting, ask your health care provider. Keep all follow-up visits. This is important. How is this prevented?   Get an annual flu shot. You may get the flu shot in late summer, fall, or winter. Ask your health care provider when you should get your flu shot. Avoid spreading your infection to other people. If you are sick: Wash your hands with soap and water often, especially after you cough or sneeze. Wash for at least 20 seconds. If soap and water are not available, use alcohol-based hand sanitizer. Cover your mouth when you cough. Cover your nose and mouth when you sneeze. Do not share cups or eating utensils. Clean commonly used objects often. Clean commonly touched surfaces. Stay home from work or school as told by your health care provider. Avoid contact with people who are sick during cold and flu season. This is generally fall and winter. Contact a health care provider if: Your symptoms last for 10 days or longer. Your symptoms get worse over  time. You have severe sinus pain in your face or forehead. The glands in your jaw or neck become very swollen. You have shortness of breath. Get help right away if you: Feel pain or pressure in your chest. Have trouble breathing. Faint or feel like you will faint. Have  severe and persistent vomiting. Feel confused or disoriented. These symptoms may represent a serious problem that is an emergency. Do not wait to see if the symptoms will go away. Get medical help right away. Call your local emergency services (911 in the [place] Do not drive yourself to the hospital. Summary A respiratory infection is an illness that affects part of the respiratory system, such as the lungs, nose, or throat. A respiratory infection that is caused by a virus is called a viral respiratory infection. Common types of viral respiratory infections include a cold, influenza, and respiratory syncytial virus (RSV) infection. Symptoms of this condition include a stuffy or runny nose, cough, fatigue, achy muscles, sore throat, and fevers or chills. Antibiotic medicines are not prescribed for viral infections. This is because antibiotics are designed to kill bacteria. They are not effective against viruses. This information is not intended to replace advice given to you by your health care provider. Make sure you discuss any questions you have with your health care provider. Document Revised: 08/25/2020 Document Reviewed: 08/25/2020 Elsevier Patient Education  [place].

## 2021-06-07 ENCOUNTER — Other Ambulatory Visit: Payer: Self-pay | Admitting: Nurse Practitioner

## 2021-06-07 DIAGNOSIS — J209 Acute bronchitis, unspecified: Secondary | ICD-10-CM

## 2021-08-17 ENCOUNTER — Encounter: Payer: Self-pay | Admitting: Nurse Practitioner

## 2021-08-17 ENCOUNTER — Other Ambulatory Visit: Payer: Self-pay

## 2021-08-17 ENCOUNTER — Ambulatory Visit: Payer: 59 | Admitting: Nurse Practitioner

## 2021-08-17 VITALS — BP 98/49 | HR 55 | Temp 97.9°F | Ht 62.0 in | Wt 124.2 lb

## 2021-08-17 DIAGNOSIS — R229 Localized swelling, mass and lump, unspecified: Secondary | ICD-10-CM

## 2021-08-17 DIAGNOSIS — R0789 Other chest pain: Secondary | ICD-10-CM | POA: Diagnosis not present

## 2021-08-17 NOTE — Progress Notes (Signed)
? ?Acute Office Visit ? ?Subjective:  ? ? Patient ID: Meagan Herrera, female    DOB: November 05, 1965, 56 y.o.   MRN: [id number] ? ?Chief Complaint  ?Patient presents with  ? Chest Pain  ?  Pt c/o left wall chest pain several years  ? ? ?HPI ?Patient is in today for left axilla pain that is intermittent for the past several years. She notices an ache when she lifts her arms in the shower. It also hurts to touch the area. She has not tried any tylenol or ibuprofen, heat or ice. Describes it as a mild discomfort. Does not radiate. She has yearly mammogram which has been normal. Denies fevers, redness, swelling to the area, and shortness of breath. She does a weekly body pump class at the gym.  ? ?Past Medical History:  ?Diagnosis Date  ? Allergic rhinitis   ? Asthma   ? ? ?Past Surgical History:  ?Procedure Laterality Date  ? NO PAST SURGERIES    ? ? ?Family History  ?Problem Relation Age of Onset  ? Emphysema Mother   ? Lung cancer Mother   ? Allergies Father   ? Emphysema Maternal Grandfather   ? ? ?Social History  ? ?Socioeconomic History  ? Marital status: Widowed  ?  Spouse name: Not on file  ? Number of children: 2  ? Years of education: Masters  ? Highest education level: Not on file  ?Occupational History  ? Occupation: [employer]  ?Tobacco Use  ? Smoking status: Never  ? Smokeless tobacco: Never  ?Vaping Use  ? Vaping Use: Never used  ?Substance and Sexual Activity  ? Alcohol use: Yes  ?  Alcohol/week: 0.0 standard drinks  ?  Comment: 0-1 drink per week  ? Drug use: No  ? Sexual activity: Not Currently  ?  Birth control/protection: Abstinence  ?Other Topics Concern  ? Not on file  ?Social History Narrative  ? Lives with 2 children  ? Caffeine use: 1/2 decaf and 1/2 regular coffee per day  ? ?Social Determinants of Health  ? ?Financial Resource Strain: Not on file  ?Food Insecurity: Not on file  ?Transportation Needs: Not on file  ?Physical Activity: Not on file  ?Stress: Not on file  ?Social Connections: Not  on file  ?Intimate Partner Violence: Not on file  ? ? ?Outpatient Medications Prior to Visit  ?Medication Sig Dispense Refill  ? albuterol (VENTOLIN HFA) 108 (90 Base) MCG/ACT inhaler INHALE 1 TO 2 PUFFS INTO THE LUNGS EVERY 6 HOURS AS NEEDED FOR WHEEZING OR SHORTNESS OF BREATH 6.7 g 0  ? Calcium Carbonate-Vit D-Min (CALTRATE 600+D PLUS PO) Take 1 Dose by mouth daily. 2000 Vit D    ? cetirizine (ZYRTEC) 10 MG tablet Take 10 mg by mouth daily.    ? doxycycline (MONODOX) 100 MG capsule Take 100 mg by mouth 2 (two) times daily.    ? montelukast (SINGULAIR) 10 MG tablet TAKE 1 TABLET(10 MG) BY MOUTH DAILY 90 tablet 3  ? Multiple Vitamins-Minerals (SUPER THERA VITE M) TABS Take by mouth.    ? ?No facility-administered medications prior to visit.  ? ? ?Allergies  ?Allergen Reactions  ? Other Other (See Comments)  ?  Other reaction(s): Eye Redness ?Other reaction(s): Wheezing ? ?Grass pollen and Ragweed  ? Ibuprofen   ?  Wheezing  ? Penicillins Rash  ? Sulfa Antibiotics Rash  ? ? ?Review of Systems ?See pertinent positives and negatives per HPI. ?   ?Objective:  ?  ?  Physical Exam ?Vitals and nursing note reviewed.  ?Constitutional:   ?   General: She is not in acute distress. ?   Appearance: Normal appearance.  ?HENT:  ?   Head: Normocephalic.  ?Eyes:  ?   Conjunctiva/sclera: Conjunctivae normal.  ?Cardiovascular:  ?   Rate and Rhythm: Normal rate and regular rhythm.  ?   Pulses: Normal pulses.  ?   Heart sounds: Normal heart sounds.  ?Pulmonary:  ?   Effort: Pulmonary effort is normal.  ?   Breath sounds: Normal breath sounds.  ?Musculoskeletal:     ?   General: Tenderness (left pectoralis muscle near axilla) present. No swelling. Normal range of motion.  ?   Cervical back: Normal range of motion.  ?Skin: ?   General: Skin is warm.  ?   Findings: No bruising, erythema or rash.  ?Neurological:  ?   General: No focal deficit present.  ?   Mental Status: She is alert and oriented to person, place, and time.  ?Psychiatric:      ?   Mood and Affect: Mood normal.     ?   Behavior: Behavior normal.     ?   Thought Content: Thought content normal.     ?   Judgment: Judgment normal.  ? ? ?BP (!) 98/49 (BP Location: Left Arm, Patient Position: Sitting, Cuff Size: Normal)   Pulse (!) 55   Temp 97.9 ?F (36.6 ?C) (Temporal)   Ht '5\' 2"'$  (1.575 m)   Wt 124 lb 3.2 oz (56.3 kg)   LMP 08/11/2013   SpO2 97%   BMI 22.72 kg/m?  ?Wt Readings from Last 3 Encounters:  ?08/17/21 124 lb 3.2 oz (56.3 kg)  ?05/24/21 124 lb 9.6 oz (56.5 kg)  ?03/23/21 123 lb 12.8 oz (56.2 kg)  ? ? ?Health Maintenance Due  ?Topic Date Due  ? MAMMOGRAM  Never done  ? Zoster Vaccines- Shingrix (1 of 2) Never done  ? PAP SMEAR-Modifier  02/18/2019  ? COVID-19 Vaccine (3 - Booster for Pfizer series) 10/29/2019  ? Fecal DNA (Cologuard)  11/01/2020  ? ? ?There are no preventive care reminders to display for this patient. ? ? ?Lab Results  ?Component Value Date  ? TSH 4.44 03/23/2021  ? ?Lab Results  ?Component Value Date  ? WBC 4.6 03/23/2021  ? HGB 13.7 03/23/2021  ? HCT 41.1 03/23/2021  ? MCV 92.3 03/23/2021  ? PLT 191.0 03/23/2021  ? ?Lab Results  ?Component Value Date  ? NA 142 03/23/2021  ? K 4.7 03/23/2021  ? CO2 25 03/23/2021  ? GLUCOSE 83 03/23/2021  ? BUN 16 03/23/2021  ? CREATININE 0.81 03/23/2021  ? BILITOT 0.6 03/23/2021  ? ALKPHOS 68 03/23/2021  ? AST 23 03/23/2021  ? ALT 14 03/23/2021  ? PROT 6.6 03/23/2021  ? ALBUMIN 4.4 03/23/2021  ? CALCIUM 9.8 03/23/2021  ? GFR 80.56 03/23/2021  ? ?Lab Results  ?Component Value Date  ? CHOL 194 03/23/2021  ? ?Lab Results  ?Component Value Date  ? HDL 74.70 03/23/2021  ? ?Lab Results  ?Component Value Date  ? LDLCALC 102 (H) 03/23/2021  ? ?Lab Results  ?Component Value Date  ? TRIG 84.0 03/23/2021  ? ?Lab Results  ?Component Value Date  ? CHOLHDL 3 03/23/2021  ? ?No results found for: HGBA1C ? ?   ?Assessment & Plan:  ? ?Problem List Items Addressed This Visit   ? ?  ? Other  ? Chest wall pain - Primary  ?  Chronic, ongoing left  intermittent chest wall pain.  Pain is worse when lifting her arms over her head.  There is point tenderness near the left pectoralis muscle near the axilla.  She gets yearly mammograms and they have been normal.  [facility]  CBC reviewed from 03/2021 which was within normal limits.  Most likely musculoskeletal, however will check ultrasound of that area as this pain has been going on for several years.  She can use heat or ice as needed along with Voltaren gel.  Follow-up if symptoms worsen or with any concerns and results of ultrasound. ?  ?  ? Relevant Orders  ? [place] CHEST SOFT TISSUE  ? ? ? ?No orders of the defined types were placed in this encounter. ? ? ? ?Charyl Dancer, NP ? ?

## 2021-08-17 NOTE — Assessment & Plan Note (Addendum)
Chronic, ongoing left intermittent chest wall pain.  Pain is worse when lifting her arms over her head.  There is point tenderness near the left pectoralis muscle near the axilla.  She gets yearly mammograms and they have been normal.  We [facility].  CBC reviewed from 03/2021 which was within normal limits.  Most likely musculoskeletal, however will check ultrasound of that area as this pain has been going on for several years.  She can use heat or ice as needed along with Voltaren gel.  Follow-up if symptoms worsen or with any concerns and results of ultrasound. ?

## 2021-08-17 NOTE — Patient Instructions (Addendum)
It was great to see you! ? ?Start voltaren gel as needed for pain. I am ordering an ultrasound and you will be called with the results.   ? ? ?Let's follow-up if your symptoms worsen or with any concerns.  ? ?Take care, ? ?Vance Peper, NP ? ?

## 2021-08-18 ENCOUNTER — Ambulatory Visit: Payer: 59 | Admitting: Nurse Practitioner

## 2021-08-21 ENCOUNTER — Telehealth: Payer: Self-pay | Admitting: Nurse Practitioner

## 2021-08-21 NOTE — Telephone Encounter (Signed)
Meagan Herrera from [facility] [phone] opt1 then opt 5, is asking for the order for Ultrasound for chest wall pain palpable is the wrong diagnosis. They need for the diagnosis to be changed before they can complete the ultrasound and the insurance to cover. ?

## 2021-08-22 NOTE — Telephone Encounter (Signed)
Done

## 2021-08-25 ENCOUNTER — Other Ambulatory Visit: Payer: Self-pay | Admitting: Nurse Practitioner

## 2021-08-25 ENCOUNTER — Ambulatory Visit
Admission: RE | Admit: 2021-08-25 | Discharge: 2021-08-25 | Disposition: A | Discharge status: Home / Self Care | Payer: 59 | Source: Ambulatory Visit | Attending: Nurse Practitioner | Admitting: Nurse Practitioner

## 2021-08-25 DIAGNOSIS — R229 Localized swelling, mass and lump, unspecified: Secondary | ICD-10-CM

## 2021-09-12 ENCOUNTER — Ambulatory Visit
Admission: RE | Admit: 2021-09-12 | Discharge: 2021-09-12 | Disposition: A | Discharge status: Home / Self Care | Payer: 59 | Source: Ambulatory Visit | Attending: Nurse Practitioner | Admitting: Nurse Practitioner

## 2021-09-12 DIAGNOSIS — R229 Localized swelling, mass and lump, unspecified: Secondary | ICD-10-CM

## 2021-09-23 ENCOUNTER — Other Ambulatory Visit: Payer: Self-pay | Admitting: Nurse Practitioner

## 2021-09-23 DIAGNOSIS — J4520 Mild intermittent asthma, uncomplicated: Secondary | ICD-10-CM

## 2021-09-26 NOTE — Telephone Encounter (Signed)
Chart supports Rx 

## 2021-10-11 NOTE — Telephone Encounter (Signed)
Please request last mammogram record. ?

## 2022-03-26 ENCOUNTER — Ambulatory Visit (INDEPENDENT_AMBULATORY_CARE_PROVIDER_SITE_OTHER): Payer: 59 | Admitting: Nurse Practitioner

## 2022-03-26 ENCOUNTER — Encounter: Payer: Self-pay | Admitting: Nurse Practitioner

## 2022-03-26 VITALS — BP 90/60 | HR 48 | Temp 97.1°F | Ht 62.0 in | Wt 124.4 lb

## 2022-03-26 DIAGNOSIS — Z13220 Encounter for screening for lipoid disorders: Secondary | ICD-10-CM | POA: Diagnosis not present

## 2022-03-26 DIAGNOSIS — Z0000 Encounter for general adult medical examination without abnormal findings: Secondary | ICD-10-CM

## 2022-03-26 DIAGNOSIS — M19041 Primary osteoarthritis, right hand: Secondary | ICD-10-CM

## 2022-03-26 DIAGNOSIS — M19042 Primary osteoarthritis, left hand: Secondary | ICD-10-CM

## 2022-03-26 DIAGNOSIS — Z136 Encounter for screening for cardiovascular disorders: Secondary | ICD-10-CM

## 2022-03-26 DIAGNOSIS — M8580 Other specified disorders of bone density and structure, unspecified site: Secondary | ICD-10-CM | POA: Insufficient documentation

## 2022-03-26 DIAGNOSIS — M810 Age-related osteoporosis without current pathological fracture: Secondary | ICD-10-CM | POA: Insufficient documentation

## 2022-03-26 LAB — CBC
HCT: 42.8 % (ref 36.0–46.0)
Hemoglobin: 14.1 g/dL (ref 12.0–15.0)
MCHC: 33.1 g/dL (ref 30.0–36.0)
MCV: 92 fl (ref 78.0–100.0)
Platelets: 197 10*3/uL (ref 150.0–400.0)
RBC: 4.65 Mil/uL (ref 3.87–5.11)
RDW: 13.2 % (ref 11.5–15.5)
WBC: 4.1 10*3/uL (ref 4.0–10.5)

## 2022-03-26 LAB — LIPID PANEL
Cholesterol: 198 mg/dL (ref 0–200)
HDL: 78.8 mg/dL (ref >39.00)
LDL Cholesterol: 98 mg/dL (ref 0–99)
NonHDL: 119.27
Total CHOL/HDL Ratio: 3
Triglycerides: 105 mg/dL (ref 0.0–149.0)
VLDL: 21 mg/dL (ref 0.0–40.0)

## 2022-03-26 LAB — COMPREHENSIVE METABOLIC PANEL
ALT: 17 U/L (ref 0–35)
AST: 27 U/L (ref 0–37)
Albumin: 4.3 g/dL (ref 3.5–5.2)
Alkaline Phosphatase: 74 U/L (ref 39–117)
BUN: 11 mg/dL (ref 6–23)
CO2: 28 mEq/L (ref 19–32)
Calcium: 9.7 mg/dL (ref 8.4–10.5)
Chloride: 105 mEq/L (ref 96–112)
Creatinine, Ser: 0.72 mg/dL (ref 0.40–1.20)
GFR: 92.14 mL/min (ref >60.00)
Glucose, Bld: 81 mg/dL (ref 70–99)
Potassium: 3.8 mEq/L (ref 3.5–5.1)
Sodium: 140 mEq/L (ref 135–145)
Total Bilirubin: 0.7 mg/dL (ref 0.2–1.2)
Total Protein: 7.1 g/dL (ref 6.0–8.3)

## 2022-03-26 NOTE — Progress Notes (Signed)
Complete physical exam  Patient: Meagan Herrera   DOB: Jan 22, 1966   56 y.o. Female  MRN: [id number] Visit Date: 03/26/2022  Subjective:    Chief Complaint  Patient presents with   Annual Exam    CPE Pt fasting  Arthritis & stiffness in hands  Has appt for Pap / Mamm the end of the month w/ GYN   Meagan Herrera is a 56 y.o. female who presents today for a complete physical exam. She reports consuming a low fat and low sodium diet.  Body pump and walking, elliptical daily  She generally feels well. She reports sleeping well. She does have additional problems to discuss today.  Vision:Yes Dental:Yes STD Screen:No  BP Readings from Last 3 Encounters:  03/26/22 90/60  08/17/21 (!) 98/49  05/24/21 102/64    Wt Readings from Last 3 Encounters:  03/26/22 124 lb 6.4 oz (56.4 kg)  08/17/21 124 lb 3.2 oz (56.3 kg)  05/24/21 124 lb 9.6 oz (56.5 kg)    Most recent fall risk assessment:    03/26/2022    8:45 AM  Fall Risk   Falls in the past year? 0  Number falls in past yr: 0  Injury with Fall? 0     Most recent depression screenings:    03/26/2022    9:11 AM 03/23/2021    8:53 AM  PHQ 2/9 Scores  PHQ - 2 Score 0 0  PHQ- 9 Score 0 0    HPI  Primary osteoarthritis of both hands Use Voltaren gel or biofreeze gel prn.   Past Medical History:  Diagnosis Date   Allergic rhinitis    Asthma    Past Surgical History:  Procedure Laterality Date   NO PAST SURGERIES     Social History   Socioeconomic History   Marital status: Widowed    Spouse name: Not on file   Number of children: 2   Years of education: Masters   Highest education level: Not on file  Occupational History   Occupation: [employer]  Tobacco Use   Smoking status: Never   Smokeless tobacco: Never  Vaping Use   Vaping Use: Never used  Substance and Sexual Activity   Alcohol use: Yes    Alcohol/week: 0.0 standard drinks of alcohol    Comment: 0-1 drink per week   Drug use: No   Sexual  activity: Not Currently    Birth control/protection: Abstinence  Other Topics Concern   Not on file  Social History Narrative   Lives with 2 children   Caffeine use: 1/2 decaf and 1/2 regular coffee per day   Social Determinants of Health   Financial Resource Strain: Not on file  Food Insecurity: Not on file  Transportation Needs: Not on file  Physical Activity: Not on file  Stress: Not on file  Social Connections: Not on file  Intimate Partner Violence: Not on file   Family Status  Relation Name Status   Mother  Deceased at age 38   Father  Alive   MGF  (Not Specified)   Family History  Problem Relation Age of Onset   Emphysema Mother    Lung cancer Mother    Allergies Father    Emphysema Maternal Grandfather    Allergies  Allergen Reactions   Other Other (See Comments)    Other reaction(s): Eye Redness Other reaction(s): Wheezing  Grass pollen and Ragweed   Ibuprofen     Wheezing   Penicillins Rash   Sulfa Antibiotics Rash  Patient Care Team: Shneur Whitten[place], Charlene Brooke, NP as PCP - General (Internal Medicine) Lady [facility], [facility]   Medications: Outpatient Medications Prior to Visit  Medication Sig   albuterol (VENTOLIN HFA) 108 (90 Base) MCG/ACT inhaler INHALE 1 TO 2 PUFFS INTO THE LUNGS EVERY 6 HOURS AS NEEDED FOR WHEEZING OR SHORTNESS OF BREATH   cetirizine (ZYRTEC) 10 MG tablet Take 10 mg by mouth daily.   montelukast (SINGULAIR) 10 MG tablet TAKE 1 TABLET(10 MG) BY MOUTH DAILY   Multiple Vitamins-Minerals (CENTRUM ADULTS) TABS Take 1 tablet by mouth daily at 12 noon.   VITAMIN D, CHOLECALCIFEROL, PO Take 1 tablet by mouth daily.   [DISCONTINUED] Multiple Vitamins-Minerals (SUPER THERA VITE M) TABS Take by mouth.   [DISCONTINUED] Calcium Carbonate-Vit D-Min (CALTRATE 600+D PLUS PO) Take 1 Dose by mouth daily. 2000 Vit D (Patient not taking: Reported on 03/26/2022)   [DISCONTINUED] doxycycline (MONODOX) 100 MG capsule Take 100 mg by mouth 2 (two)  times daily. (Patient not taking: Reported on 03/26/2022)   No facility-administered medications prior to visit.    Review of Systems  Constitutional:  Negative for fever.  HENT:  Negative for congestion and sore throat.   Eyes:        Negative for visual changes  Respiratory:  Negative for cough and shortness of breath.   Cardiovascular:  Negative for chest pain, palpitations and leg swelling.  Gastrointestinal:  Negative for blood in stool, constipation and diarrhea.  Genitourinary:  Negative for dysuria, frequency and urgency.  Musculoskeletal:  Negative for myalgias.  Skin:  Negative for rash.  Neurological:  Negative for dizziness and headaches.  Hematological:  Does not bruise/bleed easily.  Psychiatric/Behavioral:  Negative for suicidal ideas. The patient is not nervous/anxious.         Objective:  BP 90/60   Pulse (!) 48   Temp (!) 97.1 F (36.2 C) (Temporal)   Ht '5\' 2"'$  (1.575 m)   Wt 124 lb 6.4 oz (56.4 kg)   LMP 08/11/2013   SpO2 98%   BMI 22.75 kg/m       Physical Exam Vitals reviewed.  Constitutional:      General: She is not in acute distress.    Appearance: She is well-developed.  HENT:     Right Ear: Tympanic membrane, ear canal and external ear normal.     Left Ear: Tympanic membrane, ear canal and external ear normal.     Nose: Nose normal.  Eyes:     Extraocular Movements: Extraocular movements intact.     Conjunctiva/sclera: Conjunctivae normal.  Cardiovascular:     Rate and Rhythm: Normal rate and regular rhythm.     Pulses: Normal pulses.     Heart sounds: Normal heart sounds.  Pulmonary:     Effort: Pulmonary effort is normal. No respiratory distress.     Breath sounds: Normal breath sounds.  Chest:     Chest wall: No tenderness.  Abdominal:     General: Bowel sounds are normal.     Palpations: Abdomen is soft.  Genitourinary:    Comments: Deferred breast and pelvic exam to GYN Musculoskeletal:        General: Normal range of  motion.     Cervical back: Normal range of motion and neck supple.     Right lower leg: No edema.     Left lower leg: No edema.     Comments: Mild heberden nodes on right middle DIP and Left thumb DIP: no joint effusion, no  erythema  Skin:    General: Skin is warm and dry.  Neurological:     Mental Status: She is alert and oriented to person, place, and time.     Deep Tendon Reflexes: Reflexes are normal and symmetric.  Psychiatric:        Mood and Affect: Mood normal.        Behavior: Behavior normal.        Thought Content: Thought content normal.     Results for orders placed or performed in visit on 03/26/22  CBC  Result Value Ref Range   WBC 4.1 4.0 - 10.5 K/uL   RBC 4.65 3.87 - 5.11 Mil/uL   Platelets 197.0 150.0 - 400.0 K/uL   Hemoglobin 14.1 12.0 - 15.0 g/dL   HCT 42.8 36.0 - 46.0 %   MCV 92.0 78.0 - 100.0 fl   MCHC 33.1 30.0 - 36.0 g/dL   RDW 13.2 11.5 - 15.5 %  Comprehensive metabolic panel  Result Value Ref Range   Sodium 140 135 - 145 mEq/L   Potassium 3.8 3.5 - 5.1 mEq/L   Chloride 105 96 - 112 mEq/L   CO2 28 19 - 32 mEq/L   Glucose, Bld 81 70 - 99 mg/dL   BUN 11 6 - 23 mg/dL   Creatinine, Ser 0.72 0.40 - 1.20 mg/dL   Total Bilirubin 0.7 0.2 - 1.2 mg/dL   Alkaline Phosphatase 74 39 - 117 U/L   AST 27 0 - 37 U/L   ALT 17 0 - 35 U/L   Total Protein 7.1 6.0 - 8.3 g/dL   Albumin 4.3 3.5 - 5.2 g/dL   GFR 92.14 >60.00 mL/min   Calcium 9.7 8.4 - 10.5 mg/dL  Lipid panel  Result Value Ref Range   Cholesterol 198 0 - 200 mg/dL   Triglycerides 105.0 0.0 - 149.0 mg/dL   HDL 78.80 >39.00 mg/dL   VLDL 21.0 0.0 - 40.0 mg/dL   LDL Cholesterol 98 0 - 99 mg/dL   Total CHOL/HDL Ratio 3    NonHDL 119.27       Assessment & Plan:    Routine Health Maintenance and Physical Exam  Immunization History  Administered Date(s) Administered   Influenza Split 03/04/2013   Influenza,inj,quad, With Preservative 03/06/2016, 03/04/2018   Influenza-Unspecified 03/06/2016    PFIZER(Purple Top)SARS-COV-2 Vaccination 08/06/2019, 09/03/2019   Pneumococcal Polysaccharide-23 04/06/2015, 04/06/2015   Tdap 03/23/2021   Zoster Recombinat (Shingrix) 09/10/2016, 12/02/2016   Zoster, Live 09/10/2016    Health Maintenance  Topic Date Due   PAP SMEAR-Modifier  02/12/2020   COVID-19 Vaccine (3 - Pfizer series) 04/11/2022 (Originally 10/29/2019)   INFLUENZA VACCINE  09/02/2022 (Originally 01/02/2022)   MAMMOGRAM  04/01/2023   Fecal DNA (Cologuard)  04/06/2024   TETANUS/TDAP  03/24/2031   Hepatitis C Screening  Completed   HIV Screening  Completed   Zoster Vaccines- Shingrix  Completed   HPV VACCINES  Aged Out   Discussed health benefits of physical activity, and encouraged her to engage in regular exercise appropriate for her age and condition.  Problem List Items Addressed This Visit       Musculoskeletal and Integument   Primary osteoarthritis of both hands    Use Voltaren gel or biofreeze gel prn.        Other   Preventative health care - Primary   Relevant Orders   CBC (Completed)   Comprehensive metabolic panel (Completed)   Lipid panel (Completed)   Other Visit Diagnoses  Encounter for lipid screening for cardiovascular disease       Relevant Orders   Lipid panel (Completed)      Return in about 1 year (around 03/27/2023) for CPE (fasting).     Wilfred Lacy, NP

## 2022-03-26 NOTE — Patient Instructions (Addendum)
Sign medical release to get records from GYN: Dr. Matthew Saras.  Go to lab  Preventive Care 39-58 Years Old, Female Preventive care refers to lifestyle choices and visits with your health care provider that can promote health and wellness. Preventive care visits are also called wellness exams. What can I expect for my preventive care visit? Counseling Your health care provider may ask you questions about your: Medical history, including: Past medical problems. Family medical history. Pregnancy history. Current health, including: Menstrual cycle. Method of birth control. Emotional well-being. Home life and relationship well-being. Sexual activity and sexual health. Lifestyle, including: Alcohol, nicotine or tobacco, and drug use. Access to firearms. Diet, exercise, and sleep habits. Work and work Statistician. Sunscreen use. Safety issues such as seatbelt and bike helmet use. Physical exam Your health care provider will check your: Height and weight. These may be used to calculate your BMI (body mass index). BMI is a measurement that tells if you are at a healthy weight. Waist circumference. This measures the distance around your waistline. This measurement also tells if you are at a healthy weight and may help predict your risk of certain diseases, such as type 2 diabetes and high blood pressure. Heart rate and blood pressure. Body temperature. Skin for abnormal spots. What immunizations do I need?  Vaccines are usually given at various ages, according to a schedule. Your health care provider will recommend vaccines for you based on your age, medical history, and lifestyle or other factors, such as travel or where you work. What tests do I need? Screening Your health care provider may recommend screening tests for certain conditions. This may include: Lipid and cholesterol levels. Diabetes screening. This is done by checking your blood sugar (glucose) after you have not eaten for a  while (fasting). Pelvic exam and Pap test. Hepatitis B test. Hepatitis C test. HIV (human immunodeficiency virus) test. STI (sexually transmitted infection) testing, if you are at risk. Lung cancer screening. Colorectal cancer screening. Mammogram. Talk with your health care provider about when you should start having regular mammograms. This may depend on whether you have a family history of breast cancer. BRCA-related cancer screening. This may be done if you have a family history of breast, ovarian, tubal, or peritoneal cancers. Bone density scan. This is done to screen for osteoporosis. Talk with your health care provider about your test results, treatment options, and if necessary, the need for more tests. Follow these instructions at home: Eating and drinking  Eat a diet that includes fresh fruits and vegetables, whole grains, lean protein, and low-fat dairy products. Take vitamin and mineral supplements as recommended by your health care provider. Do not drink alcohol if: Your health care provider tells you not to drink. You are pregnant, may be pregnant, or are planning to become pregnant. If you drink alcohol: Limit how much you have to 0-1 drink a day. Know how much alcohol is in your drink. In the [place] one drink equals one 12 oz bottle of beer (355 mL), one 5 oz glass of wine (148 mL), or one 1 oz glass of hard liquor (44 mL). Lifestyle Brush your teeth every morning and night with fluoride toothpaste. Floss one time each day. Exercise for at least 30 minutes 5 or more days each week. Do not use any products that contain nicotine or tobacco. These products include cigarettes, chewing tobacco, and vaping devices, such as e-cigarettes. If you need help quitting, ask your health care provider. Do not use drugs. If you  are sexually active, practice safe sex. Use a condom or other form of protection to prevent STIs. If you do not wish to become pregnant, use a form of birth  control. If you plan to become pregnant, see your health care provider for a prepregnancy visit. Take aspirin only as told by your health care provider. Make sure that you understand how much to take and what form to take. Work with your health care provider to find out whether it is safe and beneficial for you to take aspirin daily. Find healthy ways to manage stress, such as: Meditation, yoga, or listening to music. Journaling. Talking to a trusted person. Spending time with friends and family. Minimize exposure to UV radiation to reduce your risk of skin cancer. Safety Always wear your seat belt while driving or riding in a vehicle. Do not drive: If you have been drinking alcohol. Do not ride with someone who has been drinking. When you are tired or distracted. While texting. If you have been using any mind-altering substances or drugs. Wear a helmet and other protective equipment during sports activities. If you have firearms in your house, make sure you follow all gun safety procedures. Seek help if you have been physically or sexually abused. What's next? Visit your health care provider once a year for an annual wellness visit. Ask your health care provider how often you should have your eyes and teeth checked. Stay up to date on all vaccines. This information is not intended to replace advice given to you by your health care provider. Make sure you discuss any questions you have with your health care provider. Document Revised: 11/16/2020 Document Reviewed: 11/16/2020 Elsevier Patient Education  [place].

## 2022-03-26 NOTE — Assessment & Plan Note (Signed)
Use Voltaren gel or biofreeze gel prn.

## 2022-03-30 ENCOUNTER — Other Ambulatory Visit: Payer: Self-pay | Admitting: Nurse Practitioner

## 2022-03-30 DIAGNOSIS — J209 Acute bronchitis, unspecified: Secondary | ICD-10-CM

## 2022-04-17 LAB — HM PAP SMEAR

## 2022-09-18 ENCOUNTER — Other Ambulatory Visit: Payer: Self-pay | Admitting: Nurse Practitioner

## 2022-09-18 DIAGNOSIS — J4520 Mild intermittent asthma, uncomplicated: Secondary | ICD-10-CM

## 2022-10-01 ENCOUNTER — Other Ambulatory Visit: Payer: Self-pay

## 2022-10-01 ENCOUNTER — Encounter: Payer: Self-pay | Admitting: Nurse Practitioner

## 2022-10-01 DIAGNOSIS — Z0000 Encounter for general adult medical examination without abnormal findings: Secondary | ICD-10-CM

## 2022-10-01 DIAGNOSIS — Z8349 Family history of other endocrine, nutritional and metabolic diseases: Secondary | ICD-10-CM

## 2022-10-02 ENCOUNTER — Other Ambulatory Visit: Payer: Self-pay

## 2022-10-02 ENCOUNTER — Telehealth: Payer: Self-pay | Admitting: Nurse Practitioner

## 2022-10-02 NOTE — Telephone Encounter (Signed)
Caller Name: [facility] Call back phone #: [phone]  Reason for Call: They have not received the actual referral request.Can you please fax to [phone].

## 2022-10-02 NOTE — Telephone Encounter (Signed)
Ordered faxed at 1:43 p

## 2022-10-02 NOTE — Telephone Encounter (Signed)
Order faxed at 10:56am and again at 3:03pm

## 2023-01-07 ENCOUNTER — Encounter: Payer: Self-pay | Admitting: Nurse Practitioner

## 2023-01-07 ENCOUNTER — Ambulatory Visit: Payer: 59 | Admitting: Nurse Practitioner

## 2023-01-07 VITALS — BP 104/80 | HR 48 | Temp 97.3°F | Resp 16 | Ht 62.0 in | Wt 123.6 lb

## 2023-01-07 DIAGNOSIS — R102 Pelvic and perineal pain: Secondary | ICD-10-CM

## 2023-01-07 NOTE — Progress Notes (Signed)
Established Patient Visit  Patient: Meagan Herrera   DOB: [birth year]   57 y.o. Female  MRN: [id number] Visit Date: 01/07/2023  Subjective:    Chief Complaint  Patient presents with   Abdominal Pain    LLQ pain/ Pelvic pain for 2 months . Movement makes it worse   Abdominal Pain This is a new problem. The current episode started more than 1 month ago. The onset quality is gradual. The problem occurs intermittently. The problem has been unchanged. The pain is located in the LLQ and suprapubic region. The quality of the pain is colicky, dull and aching. The abdominal pain does not radiate. Pertinent negatives include no anorexia, belching, constipation, diarrhea, dysuria, fever, flatus, frequency, headaches, hematochezia, hematuria, melena, myalgias, nausea, vomiting or weight loss. The pain is aggravated by movement. Relieved by: resolves spontaneously. She has tried nothing for the symptoms.  Not sexaully active, post menopausal, no change in GU/GI function. Reports fall 3months ago while walking, fell forward, landed on hands but beared most weight on left side. Denies impact on left torso or leg or hip. She is able to participate in moderate intensity exercise daily with out difficulty. Discomfort is worse for bending.  Reviewed medical, surgical, and social history today  Medications: Outpatient Medications Prior to Visit  Medication Sig   albuterol (VENTOLIN HFA) 108 (90 Base) MCG/ACT inhaler INHALE 1 TO 2 PUFFS INTO THE LUNGS EVERY 6 HOURS AS NEEDED FOR WHEEZING OR SHORTNESS OF BREATH   cetirizine (ZYRTEC) 10 MG tablet Take 10 mg by mouth daily.   montelukast (SINGULAIR) 10 MG tablet TAKE 1 TABLET(10 MG) BY MOUTH DAILY   Multiple Vitamins-Minerals (CENTRUM ADULTS) TABS Take 1 tablet by mouth daily at 12 noon.   VITAMIN D, CHOLECALCIFEROL, PO Take 1 tablet by mouth daily.   No facility-administered medications prior to visit.   Reviewed past medical and social history.    ROS per HPI above      Objective:  BP 104/80 (BP Location: Left Arm, Patient Position: Sitting, Cuff Size: Normal)   Pulse (!) 48   Temp (!) 97.3 F (36.3 C) (Temporal)   Resp 16   Ht 5\' 2"  (1.575 m)   Wt 123 lb 9.6 oz (56.1 kg)   LMP 08/11/2013   SpO2 94%   BMI 22.61 kg/m      Physical Exam Vitals reviewed.  Abdominal:     General: Abdomen is flat. Bowel sounds are normal. There is no distension.     Palpations: Abdomen is soft. There is no mass.     Tenderness: There is abdominal tenderness in the suprapubic area and left lower quadrant. There is no right CVA tenderness, left CVA tenderness, guarding or rebound. Negative signs include Murphy's sign, McBurney's sign and obturator sign.  Musculoskeletal:     Lumbar back: Normal.     Right hip: Normal.     Left hip: Normal.     Right upper leg: Normal.     Left upper leg: Normal.     Right knee: Normal.     Left knee: Normal.  Neurological:     Mental Status: She is alert.     No results found for any visits on 01/07/23.    Assessment & Plan:    Problem List Items Addressed This Visit   None Visit Diagnoses     Pelvic pain    -  Primary   Relevant Orders  US Pelvis Limited     Possible referred pain from left hip (ligament strain), pelvis US ordered to r/o any ovarian mass/cyst. Will refer for outpatient PT if normal pelvic US. Advised to Use tylenol or ibuprofen for pain  Return if symptoms worsen or fail to improve.     Alysia Penna, NP

## 2023-01-07 NOTE — Patient Instructions (Signed)
Use tylenol or ibuprofen for pain If normal pelvic US, will enter OT referral

## 2023-01-10 ENCOUNTER — Encounter: Payer: Self-pay | Admitting: Nurse Practitioner

## 2023-01-11 ENCOUNTER — Ambulatory Visit
Admission: RE | Admit: 2023-01-11 | Discharge: 2023-01-11 | Disposition: A | Discharge status: Home / Self Care | Payer: 59 | Source: Ambulatory Visit | Attending: Nurse Practitioner | Admitting: Nurse Practitioner

## 2023-01-11 DIAGNOSIS — R102 Pelvic and perineal pain: Secondary | ICD-10-CM

## 2023-01-14 ENCOUNTER — Encounter: Payer: Self-pay | Admitting: Nurse Practitioner

## 2023-01-17 NOTE — Telephone Encounter (Signed)
Pt very anxious about an [place]. [phone] She said [facility] is giving her a runaround. The tech gave her some info they shouldn't so she is worried. They have told her people have retired and some are on vacation they will get to it that was Monday. She is asking if we can call and move it along.

## 2023-01-21 ENCOUNTER — Encounter: Payer: Self-pay | Admitting: Nurse Practitioner

## 2023-01-28 IMAGING — US US SOFT TISSUE
1 series · 12 of 12 positions shown · non-contrast
Comparison: Chest x-ray dated April 12, 2020.

CLINICAL DATA: Chronic intermittent left lateral chest wall
discomfort and pulling sensation. No palpable abnormality.

EXAM:
ULTRASOUND OF CHEST SOFT TISSUES
TECHNIQUE: Ultrasound examination of the chest wall soft tissues was performed
in the area of clinical concern.

[Series 1: us soft tissue · 0.06mm/px · 12 of 12 slices shown]
[im 1/12]
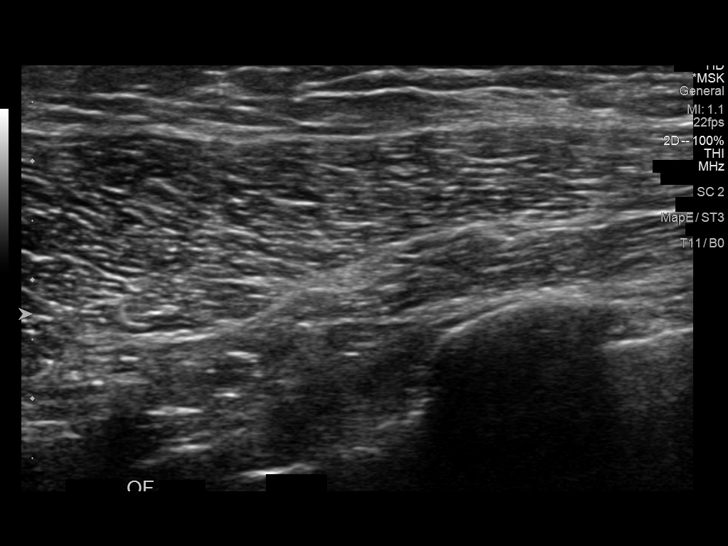
[im 2/12]
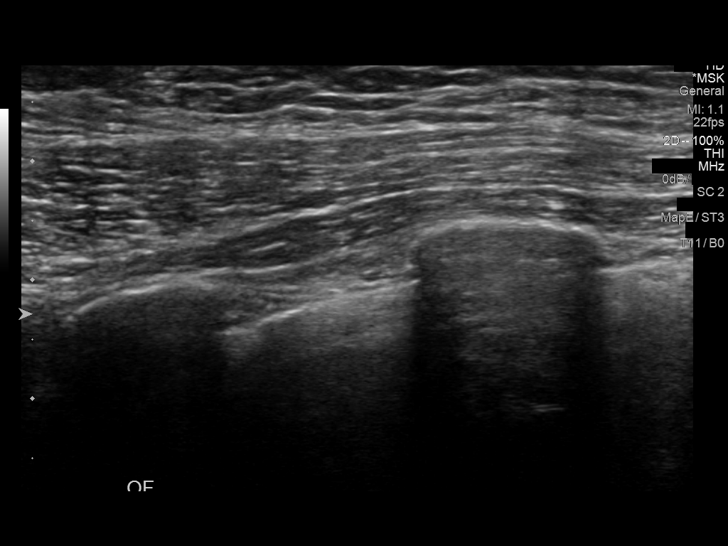
[im 3/12]
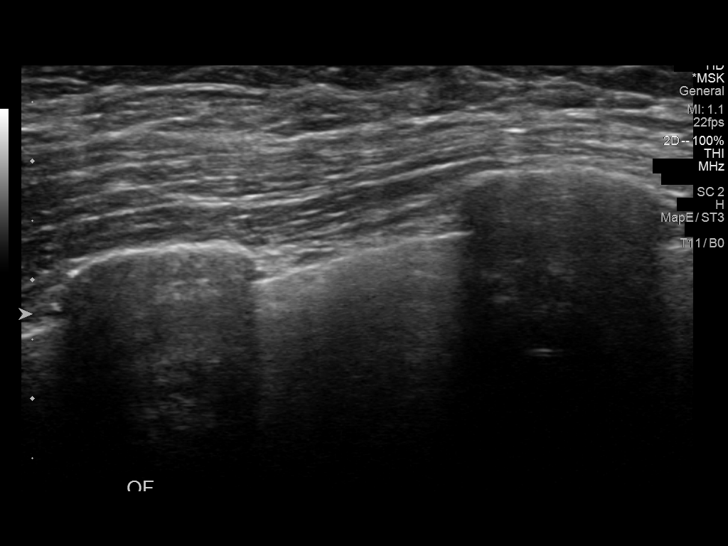
[im 4/12]
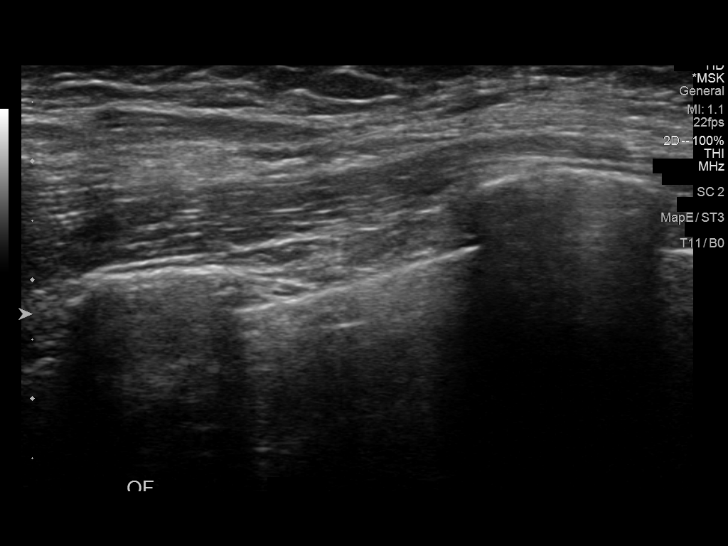
[im 5/12]
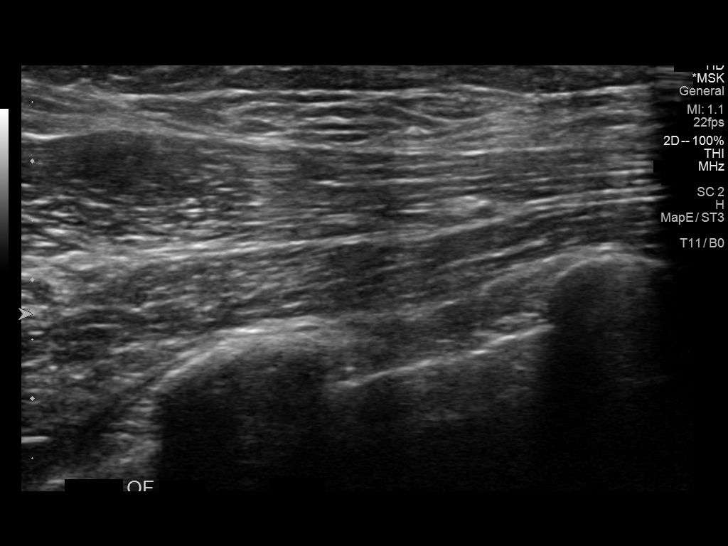
[im 6/12]
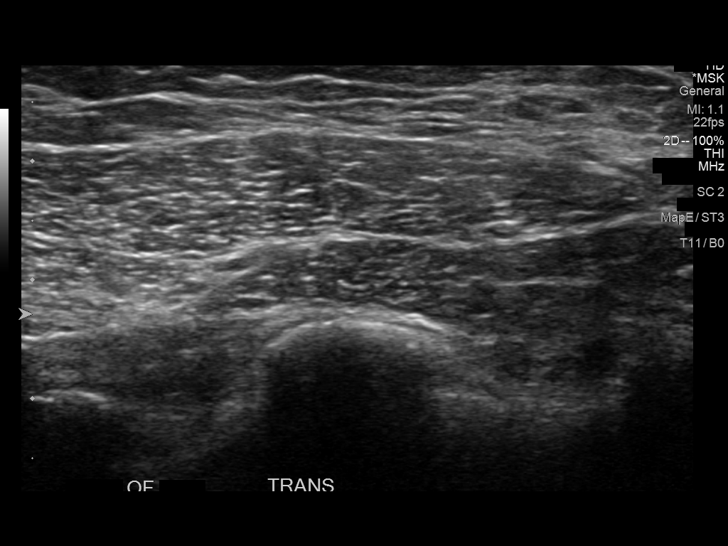
[im 7/12]
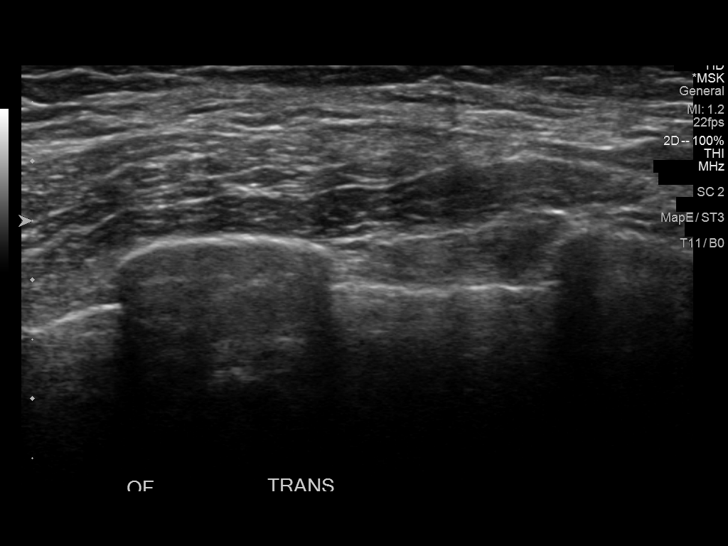
[im 8/12]
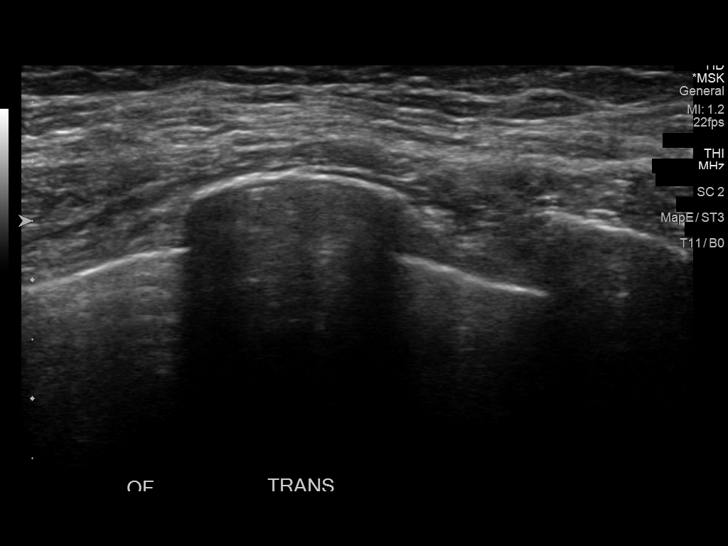
[im 9/12]
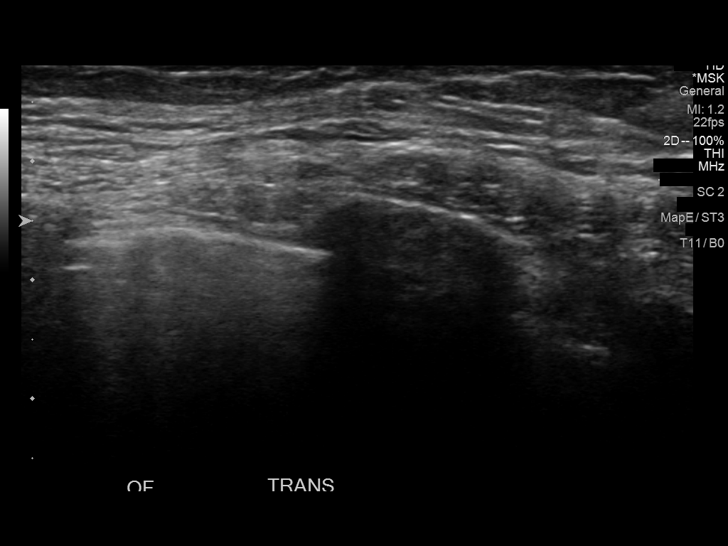
[im 10/12]
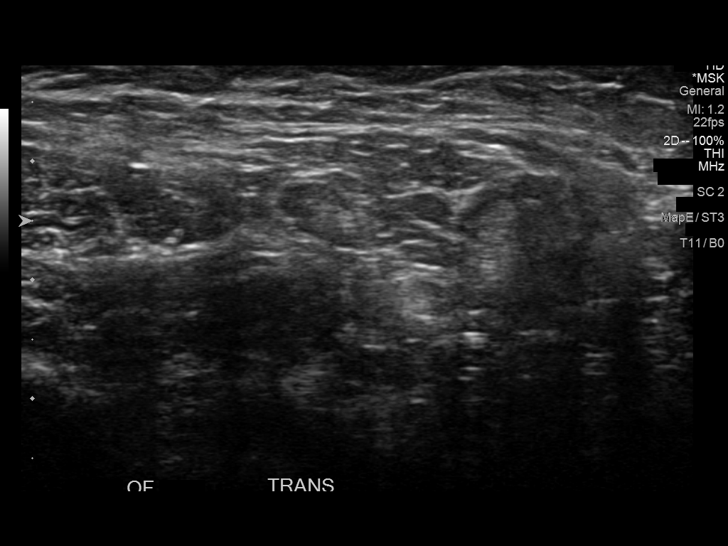
[im 11/12]
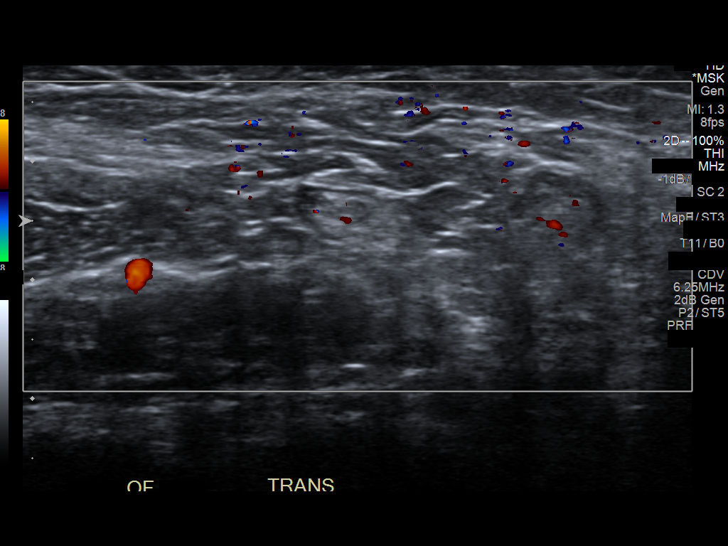
[im 12/12]
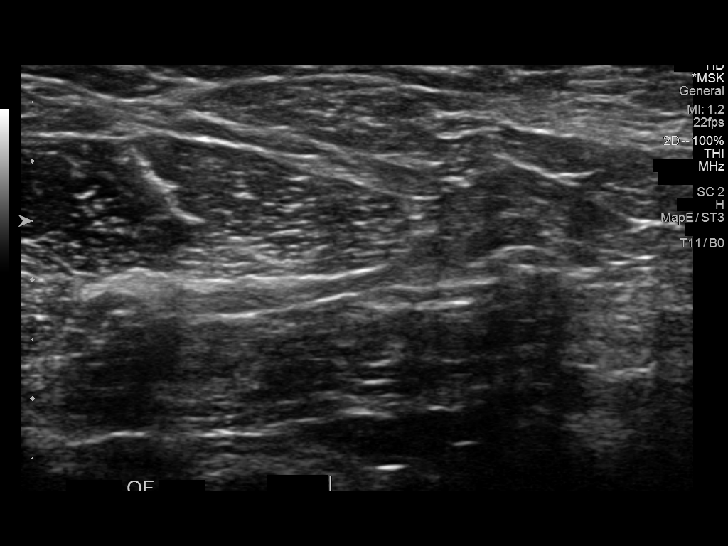

[12 of 12 positions shown; findings below may reference images not displayed]

FINDINGS: Focused ultrasound of the area of concern in the left lateral chest
wall demonstrates normal appearing chest wall muscles and overlying
subcutaneous fat. No fluid collection or soft tissue mass.
IMPRESSION: 1. Negative. No sonographic correlate for the patient's symptoms.

## 2023-04-01 ENCOUNTER — Ambulatory Visit (INDEPENDENT_AMBULATORY_CARE_PROVIDER_SITE_OTHER): Payer: 59 | Admitting: Nurse Practitioner

## 2023-04-01 ENCOUNTER — Encounter: Payer: Self-pay | Admitting: Nurse Practitioner

## 2023-04-01 VITALS — BP 104/59 | HR 52 | Temp 98.3°F | Resp 18 | Ht 62.5 in | Wt 124.8 lb

## 2023-04-01 DIAGNOSIS — Z0000 Encounter for general adult medical examination without abnormal findings: Secondary | ICD-10-CM | POA: Diagnosis not present

## 2023-04-01 DIAGNOSIS — M8580 Other specified disorders of bone density and structure, unspecified site: Secondary | ICD-10-CM | POA: Diagnosis not present

## 2023-04-01 NOTE — Assessment & Plan Note (Signed)
Last dexa scan completed by GYN per patient. Report requested. Advised to maintain weight bearing exercise, calcium and vit.D OVER THE COUNTER dose.

## 2023-04-01 NOTE — Progress Notes (Signed)
Complete physical exam  Patient: Meagan Herrera   DOB: 01-Jun-1966   57 y.o. Female  MRN: [id number] Visit Date: 04/01/2023  Subjective:    Chief Complaint  Patient presents with   Annual Exam    PT is here for annual exam. PT is due for mammogram; appointment is set [facility] in November.     Meagan Herrera is a 57 y.o. female who presents today for a complete physical exam. She reports consuming a general diet.  Cardio and strength training daily  She generally feels well. She reports sleeping well. She does not have additional problems to discuss today.  Vision:Yes Dental:Yes STD Screen:No She plans to schedule repeat mammogram and dexa scan with GYN.  BP Readings from Last 3 Encounters:  04/01/23 (!) 104/59  01/07/23 104/80  03/26/22 90/60   Wt Readings from Last 3 Encounters:  04/01/23 124 lb 12.8 oz (56.6 kg)  01/07/23 123 lb 9.6 oz (56.1 kg)  03/26/22 124 lb 6.4 oz (56.4 kg)   Most recent fall risk assessment:    04/01/2023    8:37 AM  Fall Risk   Falls in the past year? 0  Number falls in past yr: 0  Injury with Fall? 0  Risk for fall due to : No Fall Risks  Follow up Falls evaluation completed   Depression screen:Yes - No Depression  Most recent depression screenings:    04/01/2023    8:37 AM 01/07/2023   10:42 AM  PHQ 2/9 Scores  PHQ - 2 Score 0 0  PHQ- 9 Score 1    HPI  Osteopenia Last dexa scan completed by GYN per patient. Report requested. Advised to maintain weight bearing exercise, calcium and vit.D OVER THE COUNTER dose.  Past Medical History:  Diagnosis Date   Allergic rhinitis    Asthma    Past Surgical History:  Procedure Laterality Date   NO PAST SURGERIES     Social History   Socioeconomic History   Marital status: Widowed    Spouse name: Not on file   Number of children: 2   Years of education: Masters   Highest education level: Master's degree (e.g., MA, MS, MEng, MEd, MSW, MBA)  Occupational History    Occupation: [employer]  Tobacco Use   Smoking status: Never   Smokeless tobacco: Never  Vaping Use   Vaping status: Never Used  Substance and Sexual Activity   Alcohol use: Yes    Alcohol/week: 0.0 standard drinks of alcohol    Comment: 0-1 drink per week   Drug use: No   Sexual activity: Not Currently    Birth control/protection: Abstinence  Other Topics Concern   Not on file  Social History Narrative   Lives with 2 children   Caffeine use: 1/2 decaf and 1/2 regular coffee per day   Social Determinants of Health   Financial Resource Strain: Low Risk  (01/04/2023)   Overall Financial Resource Strain (CARDIA)    Difficulty of Paying Living Expenses: Not hard at all  Food Insecurity: No Food Insecurity (01/04/2023)   Hunger Vital Sign    Worried About Running Out of Food in the Last Year: Never true    Ran Out of Food in the Last Year: Never true  Transportation Needs: No Transportation Needs (01/04/2023)   PRAPARE - Administrator, Civil Service (Medical): No    Lack of Transportation (Non-Medical): No  Physical Activity: Sufficiently Active (01/04/2023)   Exercise Vital Sign  Days of Exercise per Week: 7 days    Minutes of Exercise per Session: 40 min  Stress: No Stress Concern Present (01/04/2023)   [employer] of Occupational Health - Occupational Stress Questionnaire    Feeling of Stress : Not at all  Social Connections: Unknown (01/04/2023)   Social Connection and Isolation Panel [NHANES]    Frequency of Communication with Friends and Family: Patient declined    Frequency of Social Gatherings with Friends and Family: Patient declined    Attends Religious Services: Patient declined    Database administrator or Organizations: Patient declined    Attends Engineer, structural: Not on file    Marital Status: Patient declined  Intimate Partner Violence: Not on file   Family Status  Relation Name Status   Mother  Deceased at age 55   Father   Alive   MGF  (Not Specified)  No partnership data on file   Family History  Problem Relation Age of Onset   Emphysema Mother    Lung cancer Mother    Allergies Father    Emphysema Maternal Grandfather    Allergies  Allergen Reactions   Other Other (See Comments)    Other reaction(s): Eye Redness Other reaction(s): Wheezing  Grass pollen and Ragweed   Short Ragweed Pollen Ext Other (See Comments)   Ibuprofen     Wheezing   Penicillins Rash   Sulfa Antibiotics Rash    Patient Care Team: Angelina Neece, Bonna Gains, NP as PCP - General (Internal Medicine) Ginette Otto, [facility]   Medications: Outpatient Medications Prior to Visit  Medication Sig   albuterol (VENTOLIN HFA) 108 (90 Base) MCG/ACT inhaler INHALE 1 TO 2 PUFFS INTO THE LUNGS EVERY 6 HOURS AS NEEDED FOR WHEEZING OR SHORTNESS OF BREATH   cetirizine (ZYRTEC) 10 MG tablet Take 10 mg by mouth daily.   montelukast (SINGULAIR) 10 MG tablet TAKE 1 TABLET(10 MG) BY MOUTH DAILY   Multiple Vitamins-Minerals (CENTRUM ADULTS) TABS Take 1 tablet by mouth daily at 12 noon.   VITAMIN D, CHOLECALCIFEROL, PO Take 1 tablet by mouth daily.   No facility-administered medications prior to visit.    Review of Systems  Constitutional:  Negative for activity change, appetite change and unexpected weight change.  Respiratory: Negative.    Cardiovascular: Negative.   Gastrointestinal: Negative.   Endocrine: Negative for cold intolerance and heat intolerance.  Genitourinary: Negative.   Musculoskeletal: Negative.   Skin: Negative.   Neurological: Negative.   Hematological: Negative.   Psychiatric/Behavioral:  Negative for behavioral problems, decreased concentration, dysphoric mood, hallucinations, self-injury, sleep disturbance and suicidal ideas. The patient is not nervous/anxious.         Objective:  BP (!) 104/59 (BP Location: Right Arm, Patient Position: Sitting, Cuff Size: Normal) Comment: recheck BP reading  Pulse  (!) 52   Temp 98.3 F (36.8 C) (Temporal)   Resp 18   Ht 5' 2.5" (1.588 m)   Wt 124 lb 12.8 oz (56.6 kg)   LMP 08/11/2013   SpO2 100%   BMI 22.46 kg/m     Physical Exam Vitals and nursing note reviewed.  Constitutional:      General: She is not in acute distress. HENT:     Right Ear: Tympanic membrane, ear canal and external ear normal.     Left Ear: Tympanic membrane, ear canal and external ear normal.     Nose: Nose normal.  Eyes:     Extraocular Movements: Extraocular movements intact.  Conjunctiva/sclera: Conjunctivae normal.     Pupils: Pupils are equal, round, and reactive to light.  Neck:     Thyroid: No thyroid mass, thyromegaly or thyroid tenderness.  Cardiovascular:     Rate and Rhythm: Normal rate and regular rhythm.     Pulses: Normal pulses.     Heart sounds: Normal heart sounds.  Pulmonary:     Effort: Pulmonary effort is normal.     Breath sounds: Normal breath sounds.  Abdominal:     General: Bowel sounds are normal.     Palpations: Abdomen is soft.  Musculoskeletal:        General: Normal range of motion.     Cervical back: Normal range of motion and neck supple.     Right lower leg: No edema.     Left lower leg: No edema.  Lymphadenopathy:     Cervical: No cervical adenopathy.  Skin:    General: Skin is warm and dry.  Neurological:     Mental Status: She is alert and oriented to person, place, and time.     Cranial Nerves: No cranial nerve deficit.  Psychiatric:        Mood and Affect: Mood normal.        Behavior: Behavior normal.        Thought Content: Thought content normal.      No results found for any visits on 04/01/23.    Assessment & Plan:    Routine Health Maintenance and Physical Exam  Immunization History  Administered Date(s) Administered   Influenza Split 03/04/2013   Influenza,inj,quad, With Preservative 03/06/2016, 03/04/2018   Influenza-Unspecified 03/06/2016, 03/02/2023   PFIZER(Purple Top)SARS-COV-2 Vaccination  08/06/2019, 09/03/2019   Pneumococcal Polysaccharide-23 04/06/2015, 04/06/2015   Tdap 03/23/2021   Zoster Recombinant(Shingrix) 09/10/2016, 12/02/2016   Zoster, Live 09/10/2016    Health Maintenance  Topic Date Due   MAMMOGRAM  04/01/2023   COVID-19 Vaccine (3 - 2023-24 season) 08/27/2023 (Originally 02/03/2023)   Fecal DNA (Cologuard)  04/06/2024   Cervical Cancer Screening (HPV/Pap Cotest)  04/17/2025   DTaP/Tdap/Td (2 - Td or Tdap) 03/24/2031   INFLUENZA VACCINE  Completed   Hepatitis C Screening  Completed   HIV Screening  Completed   Zoster Vaccines- Shingrix  Completed   HPV VACCINES  Aged Out    Discussed health benefits of physical activity, and encouraged her to engage in regular exercise appropriate for her age and condition.  Problem List Items Addressed This Visit     Osteopenia    Last dexa scan completed by GYN per patient. Report requested. Advised to maintain weight bearing exercise, calcium and vit.D OVER THE COUNTER dose.      Preventative health care - Primary   Relevant Orders   Comprehensive metabolic panel   Return in about 1 year (around 03/31/2024) for CPE (fasting).     Alysia Penna, NP

## 2023-04-01 NOTE — Patient Instructions (Signed)
Go to lab Continue Heart healthy diet and daily exercise.  Preventive Care 76-59 Years Old, Female Preventive care refers to lifestyle choices and visits with your health care provider that can promote health and wellness. Preventive care visits are also called wellness exams. What can I expect for my preventive care visit? Counseling Your health care provider may ask you questions about your: Medical history, including: Past medical problems. Family medical history. Pregnancy history. Current health, including: Menstrual cycle. Method of birth control. Emotional well-being. Home life and relationship well-being. Sexual activity and sexual health. Lifestyle, including: Alcohol, nicotine or tobacco, and drug use. Access to firearms. Diet, exercise, and sleep habits. Work and work Astronomer. Sunscreen use. Safety issues such as seatbelt and bike helmet use. Physical exam Your health care provider will check your: Height and weight. These may be used to calculate your BMI (body mass index). BMI is a measurement that tells if you are at a healthy weight. Waist circumference. This measures the distance around your waistline. This measurement also tells if you are at a healthy weight and may help predict your risk of certain diseases, such as type 2 diabetes and high blood pressure. Heart rate and blood pressure. Body temperature. Skin for abnormal spots. What immunizations do I need?  Vaccines are usually given at various ages, according to a schedule. Your health care provider will recommend vaccines for you based on your age, medical history, and lifestyle or other factors, such as travel or where you work. What tests do I need? Screening Your health care provider may recommend screening tests for certain conditions. This may include: Lipid and cholesterol levels. Diabetes screening. This is done by checking your blood sugar (glucose) after you have not eaten for a while  (fasting). Pelvic exam and Pap test. Hepatitis B test. Hepatitis C test. HIV (human immunodeficiency virus) test. STI (sexually transmitted infection) testing, if you are at risk. Lung cancer screening. Colorectal cancer screening. Mammogram. Talk with your health care provider about when you should start having regular mammograms. This may depend on whether you have a family history of breast cancer. BRCA-related cancer screening. This may be done if you have a family history of breast, ovarian, tubal, or peritoneal cancers. Bone density scan. This is done to screen for osteoporosis. Talk with your health care provider about your test results, treatment options, and if necessary, the need for more tests. Follow these instructions at home: Eating and drinking  Eat a diet that includes fresh fruits and vegetables, whole grains, lean protein, and low-fat dairy products. Take vitamin and mineral supplements as recommended by your health care provider. Do not drink alcohol if: Your health care provider tells you not to drink. You are pregnant, may be pregnant, or are planning to become pregnant. If you drink alcohol: Limit how much you have to 0-1 drink a day. Know how much alcohol is in your drink. In the [place] one drink equals one 12 oz bottle of beer (355 mL), one 5 oz glass of wine (148 mL), or one 1 oz glass of hard liquor (44 mL). Lifestyle Brush your teeth every morning and night with fluoride toothpaste. Floss one time each day. Exercise for at least 30 minutes 5 or more days each week. Do not use any products that contain nicotine or tobacco. These products include cigarettes, chewing tobacco, and vaping devices, such as e-cigarettes. If you need help quitting, ask your health care provider. Do not use drugs. If you are sexually active, practice  safe sex. Use a condom or other form of protection to prevent STIs. If you do not wish to become pregnant, use a form of birth control. If  you plan to become pregnant, see your health care provider for a prepregnancy visit. Take aspirin only as told by your health care provider. Make sure that you understand how much to take and what form to take. Work with your health care provider to find out whether it is safe and beneficial for you to take aspirin daily. Find healthy ways to manage stress, such as: Meditation, yoga, or listening to music. Journaling. Talking to a trusted person. Spending time with friends and family. Minimize exposure to UV radiation to reduce your risk of skin cancer. Safety Always wear your seat belt while driving or riding in a vehicle. Do not drive: If you have been drinking alcohol. Do not ride with someone who has been drinking. When you are tired or distracted. While texting. If you have been using any mind-altering substances or drugs. Wear a helmet and other protective equipment during sports activities. If you have firearms in your house, make sure you follow all gun safety procedures. Seek help if you have been physically or sexually abused. What's next? Visit your health care provider once a year for an annual wellness visit. Ask your health care provider how often you should have your eyes and teeth checked. Stay up to date on all vaccines. This information is not intended to replace advice given to you by your health care provider. Make sure you discuss any questions you have with your health care provider. Document Revised: 11/16/2020 Document Reviewed: 11/16/2020 Elsevier Patient Education  2024 [employer].

## 2023-04-02 ENCOUNTER — Telehealth: Payer: Self-pay

## 2023-04-02 NOTE — Telephone Encounter (Signed)
Message received:  "is wanting a cb, she if very unhappy with our female lab person, very."

## 2023-04-08 ENCOUNTER — Other Ambulatory Visit (INDEPENDENT_AMBULATORY_CARE_PROVIDER_SITE_OTHER): Payer: 59

## 2023-04-08 DIAGNOSIS — Z0000 Encounter for general adult medical examination without abnormal findings: Secondary | ICD-10-CM | POA: Diagnosis not present

## 2023-04-08 LAB — COMPREHENSIVE METABOLIC PANEL
ALT: 21 U/L (ref 0–35)
AST: 29 U/L (ref 0–37)
Albumin: 4.5 g/dL (ref 3.5–5.2)
Alkaline Phosphatase: 72 U/L (ref 39–117)
BUN: 10 mg/dL (ref 6–23)
CO2: 28 meq/L (ref 19–32)
Calcium: 9.8 mg/dL (ref 8.4–10.5)
Chloride: 104 meq/L (ref 96–112)
Creatinine, Ser: 0.84 mg/dL (ref 0.40–1.20)
GFR: 76.03 mL/min (ref >60.00)
Glucose, Bld: 85 mg/dL (ref 70–99)
Potassium: 4.5 meq/L (ref 3.5–5.1)
Sodium: 140 meq/L (ref 135–145)
Total Bilirubin: 0.6 mg/dL (ref 0.2–1.2)
Total Protein: 7 g/dL (ref 6.0–8.3)

## 2023-09-13 ENCOUNTER — Other Ambulatory Visit: Payer: Self-pay | Admitting: Nurse Practitioner

## 2023-09-13 DIAGNOSIS — J4520 Mild intermittent asthma, uncomplicated: Secondary | ICD-10-CM

## 2023-09-16 ENCOUNTER — Other Ambulatory Visit: Payer: Self-pay

## 2023-09-16 DIAGNOSIS — J4520 Mild intermittent asthma, uncomplicated: Secondary | ICD-10-CM

## 2023-09-16 LAB — HM DEXA SCAN

## 2023-09-16 MED ORDER — MONTELUKAST SODIUM 10 MG PO TABS: ORAL_TABLET | ORAL | 3 refills | Status: AC

## 2023-10-02 NOTE — Progress Notes (Signed)
   I, Miquel Amen, CMA acting as a scribe for Garlan Juniper, MD.  Leita Fitch is a 58 y.o. female who presents to [facility] today for osteoporosis management. Family hx of heart disease and cancer  DEXA scan (date, T-score): 09/16/23: Spine= -1.9, L-FN= -2.5, R-FN= -2.6 Prior treatment: no History of Hip, Spine, or Wrist Fx: no Heart disease or stroke: no Cancer: no Kidney Disease: no Gastric/Peptic Ulcer: no Gastric bypass surgery: no Severe GERD: no Hx of seizures: no Age at Menopause: 58y/o Calcium intake: supplementing, eats dairy Vitamin D  intake: yes, 2,000 daily Hormone replacement therapy: no Smoking history: never smoked Alcohol: yes, 3-5 x weekly, wine, saki  Exercise: yes, body pump Major dental work in past year: no Parents with hip/spine fracture: no Height loss: yes: 64" to 62.5"   Pertinent review of systems: No fevers or chills  Relevant historical information: Asthma.  Takes Singulair  and Zyrtec.   Exam:  BP 100/62   Pulse (!) 55   Ht 5' 2.5" (1.588 m)   Wt 124 lb (56.2 kg)   LMP 08/11/2013   SpO2 98%   BMI 22.32 kg/m  General: Well Developed, well nourished, and in no acute distress.   MSK: Normal lumbar motion normal gait.      Assessment and Plan: 58 y.o. female with osteoporosis.  Vitamin D  has been repleted and she takes calcium and vitamin D .  She is a frequent exerciser participating in body pump walking elliptical and spin class.  She is very not excited about taking medications.  I think she could optimize her resistance training with [facility].  She already has done a great job of optimizing everything else.  If this is not sufficient after recheck of bone density and about 2 years from her most recent DEXA scan would recommend medications.  We spent some time talking about medicines today.  She generally is not excited about any of them but that is a decision for a later date.   PDMP not reviewed  this encounter. No orders of the defined types were placed in this encounter.  No orders of the defined types were placed in this encounter.    Discussed warning signs or symptoms. Please see discharge instructions. Patient expresses understanding.   The above documentation has been reviewed and is accurate and complete Garlan Juniper, M.D.

## 2023-10-03 ENCOUNTER — Encounter: Payer: Self-pay | Admitting: Family Medicine

## 2023-10-03 ENCOUNTER — Ambulatory Visit: Admitting: Family Medicine

## 2023-10-03 VITALS — BP 100/62 | HR 55 | Ht 62.5 in | Wt 124.0 lb

## 2023-10-03 DIAGNOSIS — M810 Age-related osteoporosis without current pathological fracture: Secondary | ICD-10-CM

## 2023-10-03 NOTE — Patient Instructions (Signed)
 Thank you for coming in today.   OsteoStrong  Fosamax  Reclast  Prolia

## 2023-10-04 ENCOUNTER — Encounter: Payer: Self-pay | Admitting: Family Medicine

## 2023-11-19 ENCOUNTER — Telehealth: Payer: Self-pay

## 2023-11-19 NOTE — Telephone Encounter (Signed)
 Copied from CRM [id number]. Topic: General - Other >> Nov 19, 2023  9:21 AM Dorisann Garre T wrote: Reason for CRM: patient needing a order to see a orthopedics dr she is having issues with the same shoulder she has had injury to twice before its her left shoulder causing her pain again she would like a call back regarding this

## 2023-11-19 NOTE — Telephone Encounter (Signed)
 Spoke with pt about concerns. Pt was going to [facility], a facility that uses low weight-bearing exercises to help with osteoporosis, as an alternative to medication therapy. Advised the need for an appt to be seen, address concerns, and to place order for referral. LOV 03/2023. Pt states she is in route to [facility] for a week at the moment. I advised PCP due to return Tues 6/24. Pt states she is trying to be seen by PT on Monday. Advised that I can send to Doc of the Day and see of willing to place order, but cannot guarantee. Pt is requesting a call if referral order is placed and she will FU with referred to clinic, requesting [facility] on [address]. Routing to Doc of the Day.

## 2023-11-21 NOTE — Telephone Encounter (Signed)
 Patient scheduled 11/25/23

## 2023-11-25 ENCOUNTER — Encounter: Payer: Self-pay | Admitting: Family Medicine

## 2023-11-25 ENCOUNTER — Ambulatory Visit: Admitting: Family Medicine

## 2023-11-25 VITALS — BP 102/68 | HR 60 | Temp 97.4°F | Ht 62.0 in | Wt 122.6 lb

## 2023-11-25 DIAGNOSIS — S46912A Strain of unspecified muscle, fascia and tendon at shoulder and upper arm level, left arm, initial encounter: Secondary | ICD-10-CM | POA: Diagnosis not present

## 2023-11-25 DIAGNOSIS — S46812A Strain of other muscles, fascia and tendons at shoulder and upper arm level, left arm, initial encounter: Secondary | ICD-10-CM | POA: Diagnosis not present

## 2023-11-25 NOTE — Progress Notes (Signed)
 Established Patient Office Visit   Subjective:  Patient ID: Meagan Herrera, female    DOB: [birth year]  Age: 58 y.o. MRN: [id number]  Chief Complaint  Patient presents with   Shoulder Pain    Pt thinks she popped her left shoulder 2 weeks ago while doing pushing exercises at [employer]. Pain with movement and manipulation. Pain occasionally radites to different areas. Hx of osteoporosis.     Shoulder Pain  Pertinent negatives include no tingling.   Encounter Diagnoses  Name Primary?   Strain of left trapezius muscle, initial encounter Yes   Strain of left shoulder, initial encounter    Develop left shoulder pain after using a machine at [facility].  This is an alternative to medication for treatment of osteoporosis.  While doing so she developed pain in her posterior left shoulder along the anterior edge of the scapula.  She is status post chiropractic treatment with some relief.  She is interested in the possibility of physical therapy.   Review of Systems  Constitutional: Negative.   HENT: Negative.    Eyes:  Negative for blurred vision, discharge and redness.  Respiratory: Negative.    Cardiovascular: Negative.   Gastrointestinal:  Negative for abdominal pain.  Genitourinary: Negative.   Musculoskeletal:  Positive for joint pain. Negative for myalgias.  Skin:  Negative for rash.  Neurological:  Negative for tingling, loss of consciousness and weakness.  Endo/Heme/Allergies:  Negative for polydipsia.     Current Outpatient Medications:    albuterol  (VENTOLIN  HFA) 108 (90 Base) MCG/ACT inhaler, INHALE 1 TO 2 PUFFS INTO THE LUNGS EVERY 6 HOURS AS NEEDED FOR WHEEZING OR SHORTNESS OF BREATH, Disp: 6.7 g, Rfl: 0   cetirizine (ZYRTEC) 10 MG tablet, Take 10 mg by mouth daily., Disp: , Rfl:    montelukast  (SINGULAIR ) 10 MG tablet, TAKE 1 TABLET(10 MG) BY MOUTH DAILY, Disp: 90 tablet, Rfl: 3   Multiple Vitamins-Minerals (CENTRUM ADULTS) TABS, Take 1 tablet by mouth daily at 12  noon., Disp: , Rfl:    VITAMIN D , CHOLECALCIFEROL, PO, Take 1 tablet by mouth daily., Disp: , Rfl:    benzonatate (TESSALON) 200 MG capsule, TAKE 1 CAPSULE BY MOUTH THREE TIMES DAILY FOR 7 DAYS FOR COUGH, Disp: , Rfl:    promethazine-dextromethorphan (PROMETHAZINE-DM) 6.25-15 MG/5ML syrup, TAKE 10 ML BY MOUTH EVERY DAY AT BEDTIME FOR 5 DAYS FOR COUGH, Disp: , Rfl:    Resveratrol 100 MG CAPS, , Disp: , Rfl:    Objective:     BP 102/68 (Cuff Size: Normal)   Pulse 60   Temp (!) 97.4 F (36.3 C) (Temporal)   Ht 5' 2 (1.575 m)   Wt 122 lb 9.6 oz (55.6 kg)   LMP 08/11/2013   SpO2 98%   BMI 22.42 kg/m    Physical Exam Constitutional:      General: She is not in acute distress.    Appearance: Normal appearance. She is not ill-appearing, toxic-appearing or diaphoretic.  HENT:     Head: Normocephalic and atraumatic.     Right Ear: External ear normal.     Left Ear: External ear normal.   Eyes:     General: No scleral icterus.       Right eye: No discharge.        Left eye: No discharge.     Extraocular Movements: Extraocular movements intact.     Conjunctiva/sclera: Conjunctivae normal.   Pulmonary:     Effort: Pulmonary effort is normal. No respiratory distress.  Musculoskeletal:     Left shoulder: Tenderness present. No bony tenderness. Normal range of motion.       Arms:   Skin:    General: Skin is warm and dry.   Neurological:     Mental Status: She is alert and oriented to person, place, and time.   Psychiatric:        Mood and Affect: Mood normal.        Behavior: Behavior normal.      No results found for any visits on 11/25/23.    The 10-year ASCVD risk score (Arnett DK, et al., 2019) is: 1.7%    Assessment & Plan:   Strain of left trapezius muscle, initial encounter -     Ambulatory referral to Sports Medicine  Strain of left shoulder, initial encounter -     Ambulatory referral to Sports Medicine    Return Schedule follow-up with [facility]  as needed.SABRA Elsie Sim Berneta, MD
